# Patient Record
Sex: Female | Born: 1980 | Hispanic: Yes | Marital: Single | Smoking: Never smoker
Health system: Southern US, Community
[De-identification: ages and names within clinical notes are randomized; demographics above are authoritative.]

## PROBLEM LIST (undated history)

## (undated) DIAGNOSIS — I10 Essential (primary) hypertension: Secondary | ICD-10-CM

## (undated) HISTORY — DX: Essential (primary) hypertension: I10

---

## 2007-03-26 ENCOUNTER — Ambulatory Visit: Payer: Self-pay | Admitting: Internal Medicine

## 2007-03-27 ENCOUNTER — Ambulatory Visit: Payer: Self-pay | Admitting: *Deleted

## 2008-12-29 ENCOUNTER — Emergency Department (HOSPITAL_COMMUNITY): Admission: EM | Admit: 2008-12-29 | Discharge: 2008-12-29 | Payer: Self-pay | Admitting: Emergency Medicine

## 2010-08-26 LAB — CBC
MCHC: 34.3 g/dL (ref 30.0–36.0)
MCV: 93.4 fL (ref 78.0–100.0)
Platelets: 300 10*3/uL (ref 150–400)
RDW: 13.4 % (ref 11.5–15.5)
WBC: 16.9 10*3/uL — ABNORMAL HIGH (ref 4.0–10.5)

## 2010-08-26 LAB — URINALYSIS, ROUTINE W REFLEX MICROSCOPIC
Glucose, UA: NEGATIVE mg/dL
Leukocytes, UA: NEGATIVE
Specific Gravity, Urine: 1.019 (ref 1.005–1.030)

## 2010-08-26 LAB — DIFFERENTIAL
Eosinophils Relative: 1 % (ref 0–5)
Lymphocytes Relative: 24 % (ref 12–46)
Lymphs Abs: 4.1 10*3/uL — ABNORMAL HIGH (ref 0.7–4.0)
Monocytes Absolute: 0.7 10*3/uL (ref 0.1–1.0)
Monocytes Relative: 4 % (ref 3–12)

## 2010-08-26 LAB — COMPREHENSIVE METABOLIC PANEL
AST: 33 U/L (ref 0–37)
Albumin: 3.8 g/dL (ref 3.5–5.2)
Calcium: 8.8 mg/dL (ref 8.4–10.5)
Chloride: 107 mEq/L (ref 96–112)
Creatinine, Ser: 0.61 mg/dL (ref 0.4–1.2)
GFR calc Af Amer: >60 mL/min (ref >60)
Total Protein: 6.9 g/dL (ref 6.0–8.3)

## 2010-08-26 LAB — URINE MICROSCOPIC-ADD ON

## 2013-09-24 ENCOUNTER — Encounter (HOSPITAL_COMMUNITY): Payer: Self-pay | Admitting: Emergency Medicine

## 2013-09-24 ENCOUNTER — Emergency Department (INDEPENDENT_AMBULATORY_CARE_PROVIDER_SITE_OTHER)
Admission: EM | Admit: 2013-09-24 | Discharge: 2013-09-24 | Disposition: A | Discharge status: Home / Self Care | Payer: Self-pay | Source: Home / Self Care | Attending: Family Medicine | Admitting: Family Medicine

## 2013-09-24 DIAGNOSIS — J329 Chronic sinusitis, unspecified: Secondary | ICD-10-CM

## 2013-09-24 DIAGNOSIS — J45909 Unspecified asthma, uncomplicated: Secondary | ICD-10-CM

## 2013-09-24 MED ORDER — PREDNISONE 10 MG PO TABS: ORAL_TABLET | ORAL | Status: DC

## 2013-09-24 MED ORDER — FLUTICASONE PROPIONATE 50 MCG/ACT NA SUSP: 2.0000 | Freq: Two times a day (BID) | NASAL | Status: DC

## 2013-09-24 MED ORDER — ALBUTEROL SULFATE HFA 108 (90 BASE) MCG/ACT IN AERS
INHALATION_SPRAY | RESPIRATORY_TRACT | Status: AC
  Filled 2013-09-24: qty 6.7

## 2013-09-24 MED ORDER — IPRATROPIUM-ALBUTEROL 0.5-2.5 (3) MG/3ML IN SOLN
RESPIRATORY_TRACT | Status: AC
  Filled 2013-09-24: qty 3

## 2013-09-24 MED ORDER — FEXOFENADINE-PSEUDOEPHED ER 60-120 MG PO TB12: 1.0000 | ORAL_TABLET | Freq: Two times a day (BID) | ORAL | Status: DC

## 2013-09-24 MED ORDER — IPRATROPIUM-ALBUTEROL 0.5-2.5 (3) MG/3ML IN SOLN
3.0000 mL | Freq: Once | RESPIRATORY_TRACT | Status: AC
  Administered 2013-09-24: 3 mL via RESPIRATORY_TRACT

## 2013-09-24 MED ORDER — ALBUTEROL SULFATE HFA 108 (90 BASE) MCG/ACT IN AERS
1.0000 | INHALATION_SPRAY | RESPIRATORY_TRACT | Status: DC
  Administered 2013-09-24: 2 via RESPIRATORY_TRACT

## 2013-09-24 MED ORDER — AEROCHAMBER PLUS FLO-VU LARGE MISC
1.0000 | Freq: Once | Status: AC
  Administered 2013-09-24: 1

## 2013-09-24 MED ORDER — KETOTIFEN FUMARATE 0.025 % OP SOLN: 1.0000 [drp] | Freq: Two times a day (BID) | OPHTHALMIC | Status: DC

## 2013-09-24 MED ORDER — AZITHROMYCIN 250 MG PO TABS: ORAL_TABLET | ORAL | Status: DC

## 2013-09-24 NOTE — ED Provider Notes (Signed)
CSN: [id number]     Arrival date & time 09/24/13  1102 History   None    Chief Complaint  Patient presents with  . Allergies   (Consider location/radiation/quality/duration/timing/severity/associated sxs/prior Treatment) HPI Comments: 33 year old female presents complaining of allergies. She has itchy, watery, red, irritated eyes, scratchy throat, nasal congestion, cough, chest tightness, and bilateral jaw pain. These symptoms have been present for a month and have been constant, neither worsening nor getting any better. She has not taken any medication to try to help her symptoms. She denies fever, chills, chest pain, shortness of breath. Cough is dry, nonproductive. She has previous history of seasonal allergies. She has no history of asthma.   History reviewed. No pertinent past medical history. History reviewed. No pertinent past surgical history. No family history on file. History  Substance Use Topics  . Smoking status: Never Smoker   . Smokeless tobacco: Not on file  . Alcohol Use: No   OB History   Grav Para Term Preterm Abortions TAB SAB Ect Mult Living                 Review of Systems  Constitutional: Negative for fever, chills and fatigue.  HENT: Positive for postnasal drip, rhinorrhea, sinus pressure and sore throat (scratchy). Negative for congestion and ear pain.   Eyes: Positive for pain, discharge, redness and itching.  Respiratory: Positive for cough and chest tightness. Negative for shortness of breath.   Cardiovascular: Negative for chest pain.  Gastrointestinal: Negative for nausea, vomiting, abdominal pain and diarrhea.  Musculoskeletal: Negative for back pain.  Skin: Negative for rash.  All other systems reviewed and are negative.   Allergies  Review of patient's allergies indicates not on file.  Home Medications   Prior to Admission medications   Not on File   BP 150/92  Pulse 80  Temp(Src) 98.4 F (36.9 C) (Oral)  Resp 14  SpO2 98%  LMP  09/22/2013 Physical Exam  Nursing note and vitals reviewed. Constitutional: She is oriented to person, place, and time. Vital signs are normal. She appears well-developed and well-nourished. No distress.  HENT:  Head: Normocephalic and atraumatic.  Right Ear: Tympanic membrane, external ear and ear canal normal.  Left Ear: Tympanic membrane, external ear and ear canal normal.  Nose: Mucosal edema and rhinorrhea present. Right sinus exhibits frontal sinus tenderness. Right sinus exhibits no maxillary sinus tenderness. Left sinus exhibits frontal sinus tenderness. Left sinus exhibits no maxillary sinus tenderness.  Mouth/Throat: Uvula is midline, oropharynx is clear and moist and mucous membranes are normal.  Neck: Normal range of motion. Neck supple.  Cardiovascular: Normal rate, regular rhythm and normal heart sounds.  Exam reveals no gallop and no friction rub.   No murmur heard. Pulmonary/Chest: Effort normal. No accessory muscle usage. Not tachypneic. No respiratory distress. She has wheezes (scattered ). She has no rhonchi. She has no rales.  Lymphadenopathy:       Head (right side): Preauricular adenopathy present.       Head (left side): Preauricular adenopathy present.  Neurological: She is alert and oriented to person, place, and time. She has normal strength. Coordination normal.  Skin: Skin is warm and dry. No rash noted. She is not diaphoretic.  Psychiatric: She has a normal mood and affect. Judgment normal.    ED Course  Procedures (including critical care time) Labs Review Labs Reviewed - No data to display  Imaging Review No results found.   MDM   1. Hay fever with asthma  2. Sinusitis    After 1 DuoNeb, there is still some wheezing although it has resolved significantly, she feels much better. Will discharge with treatment for a fever of asthma, also azithromycin to cover possible atypical infection. Followup when necessary if not improving   Meds ordered this  encounter  Medications  . ipratropium-albuterol (DUONEB) 0.5-2.5 (3) MG/3ML nebulizer solution 3 mL    Sig:   . predniSONE (DELTASONE) 10 MG tablet    Sig: 4 tabs PO QD for 4 days; 3 tabs PO QD for 3 days; 2 tabs PO QD for 2 days; 1 tab PO QD for 1 day    Dispense:  30 tablet    Refill:  0    Order Specific Question:  Supervising Provider    Answer:  Clementeen GrahamOREY, EVAN, S [[id number]]  . fluticasone (FLONASE) 50 MCG/ACT nasal spray    Sig: Place 2 sprays into both nostrils 2 (two) times daily. Decrease to 2 sprays/nostril daily after 5 days    Dispense:  16 g    Refill:  2    Order Specific Question:  Supervising Provider    Answer:  Clementeen GrahamOREY, EVAN, S [[id number]]  . azithromycin (ZITHROMAX Z-PAK) 250 MG tablet    Sig: Use as directed    Dispense:  6 each    Refill:  0    Order Specific Question:  Supervising Provider    Answer:  Clementeen GrahamOREY, EVAN, S [[id number]]  . ketotifen (ZADITOR) 0.025 % ophthalmic solution    Sig: Place 1 drop into both eyes 2 (two) times daily.    Dispense:  5 mL    Refill:  0    Order Specific Question:  Supervising Provider    Answer:  Clementeen GrahamOREY, EVAN, S [id number]]  . fexofenadine-pseudoephedrine (ALLEGRA-D) 60-120 MG per tablet    Sig: Take 1 tablet by mouth every 12 (twelve) hours.    Dispense:  30 tablet    Refill:  0    Order Specific Question:  Supervising Provider    Answer:  Clementeen GrahamOREY, EVAN, S [id number]]  . albuterol (PROVENTIL HFA;VENTOLIN HFA) 108 (90 BASE) MCG/ACT inhaler 1-2 puff    Sig:   . AEROCHAMBER PLUS FLO-VU LARGE MISC 1 each    Sig:        Joy GoodZachary H Jonte Shiller, PA-C 09/24/13 1253

## 2013-09-24 NOTE — ED Notes (Signed)
Pt c/o allergies onset 1 month Sx include itchy/watery/irritated/redness of eyes Also having itchy throat, congestion, runny nose Alert w/no signs of acute distress.

## 2013-09-26 NOTE — ED Provider Notes (Signed)
Medical screening examination/treatment/procedure(s) were performed by a resident physician or non-physician practitioner and as the supervising physician I was immediately available for consultation/collaboration.  Victorya Hillman, MD    Maleik Vanderzee S Abigale Dorow, MD 09/26/13 0858 

## 2014-09-07 ENCOUNTER — Ambulatory Visit: Payer: Self-pay | Attending: Internal Medicine

## 2014-09-08 ENCOUNTER — Encounter (HOSPITAL_COMMUNITY): Payer: Self-pay | Admitting: Emergency Medicine

## 2014-09-08 ENCOUNTER — Emergency Department (HOSPITAL_COMMUNITY)
Admission: EM | Admit: 2014-09-08 | Discharge: 2014-09-08 | Disposition: A | Discharge status: Home / Self Care | Payer: No Typology Code available for payment source | Source: Home / Self Care | Attending: Family Medicine | Admitting: Family Medicine

## 2014-09-08 DIAGNOSIS — J309 Allergic rhinitis, unspecified: Secondary | ICD-10-CM

## 2014-09-08 DIAGNOSIS — J0140 Acute pansinusitis, unspecified: Secondary | ICD-10-CM

## 2014-09-08 DIAGNOSIS — H6983 Other specified disorders of Eustachian tube, bilateral: Secondary | ICD-10-CM

## 2014-09-08 MED ORDER — IPRATROPIUM BROMIDE 0.06 % NA SOLN: 2.0000 | Freq: Four times a day (QID) | NASAL | Status: DC

## 2014-09-08 MED ORDER — PREDNISONE 20 MG PO TABS
60.0000 mg | ORAL_TABLET | Freq: Once | ORAL | Status: AC
  Administered 2014-09-08: 60 mg via ORAL

## 2014-09-08 MED ORDER — FLUTICASONE PROPIONATE 50 MCG/ACT NA SUSP: 2.0000 | Freq: Every day | NASAL | Status: DC

## 2014-09-08 MED ORDER — FLUCONAZOLE 150 MG PO TABS: 150.0000 mg | ORAL_TABLET | Freq: Every day | ORAL | Status: DC

## 2014-09-08 MED ORDER — OLOPATADINE HCL 0.2 % OP SOLN: 1.0000 [drp] | Freq: Every day | OPHTHALMIC | Status: DC

## 2014-09-08 MED ORDER — AMOXICILLIN-POT CLAVULANATE 875-125 MG PO TABS: 1.0000 | ORAL_TABLET | Freq: Two times a day (BID) | ORAL | Status: DC

## 2014-09-08 MED ORDER — PREDNISONE 20 MG PO TABS
ORAL_TABLET | ORAL | Status: AC
  Filled 2014-09-08: qty 3

## 2014-09-08 NOTE — ED Notes (Signed)
Patient c/o rhinorrhea, watery itchy eyes, and sinus headaches. Patient reports she has been taking Cetrizine and OTC eyedrops with no relief. Patient reports she had a fever last night. Patient is in NAD.

## 2014-09-08 NOTE — Discharge Instructions (Signed)
You likely have developed a sinus infection that will need antibiotics in order to resolve. Please take them for the full 10 days. Please take the Diflucan if she developed a yeast infection. The majority of your symptoms are related to allergies. Your given a dose of steroids to help with your symptoms. Please start the nasal Atrovent to help with nasal discharge, the Flonase to help decrease your sinus and nasal inflammation. Please start a daily allergy pill such as Zyrtec, Allegra, or Claritin. Please start using the Pataday drops for eye relief, you can purchase over-the-counter Zaditor if the Pataday is too expensive. Please also consider using Benadryl at night to help with her symptoms and so you can get some sleep. Sudafed might also be a good medicine use during the daytime though this may raise her blood pressure can give you palpitations of the heart.  Es probable que haya desarrollado una infeccin en los senos que se necesitan antibiticos, a fin de [place]. Por favor, llevarlos a los 10 das completos. Por favor tome el Diflucan si se desarrolla una infeccin por levaduras. La mayora de sus sntomas estn relacionados con alergias. Su administra una dosis de esteroides para ayudar con sus sntomas. Por favor iniciar el Atrovent nasal para ayudar con la secrecin nasal, la Flonase para ayudar a disminuir la inflamacin nasal y los senos paranasales. Por favor, iniciar una pldora diaria de alergia tales como Zyrtec, Allegra, o Claritin. Por favor, empezar a Therapist, artusar el Pataday gotas para el alivio del ojo, puede comprar over-the-counter Zaditor si el Pataday es demasiado caro. Por favor, considere el uso de Benadryl por la noche para ayudar con sus sntomas y para que pueda dormir un poco. Sudafed tambin podra ser un buen uso de los medicamentos durante [address], aunque esto puede aumentar su presin arterial le puede dar palpitaciones del corazn.

## 2014-09-08 NOTE — ED Provider Notes (Signed)
CSN: [id number]     Arrival date & time 09/08/14  1221 History   First MD Initiated Contact with Patient 09/08/14 1343     Chief Complaint  Patient presents with  . Allergies   (Consider location/radiation/quality/duration/timing/severity/associated sxs/prior Treatment) HPI   1 week ago devloped red itchy eyes, rinorrhea, and HA. HA in the front of head and worse w/ leaning forward. Getting worse. Started subjective fevers last night. tyleno 500 w/ benefit. H/o allergies every spring.  Denies chest pain, palpitations, nausea, vomiting, diarrhea, constipation, vomiting, rash, shortness of breath. Now also with bilateral ear pain.   History reviewed. No pertinent past medical history. History reviewed. No pertinent past surgical history. Family History  Problem Relation Age of Onset  . Cancer Neg Hx   . Diabetes Neg Hx   . Heart failure Neg Hx   . Hyperlipidemia Neg Hx   . Hypertension Neg Hx    History  Substance Use Topics  . Smoking status: Never Smoker   . Smokeless tobacco: Not on file  . Alcohol Use: No   OB History    No data available     Review of Systems Per HPI with all other pertinent systems negative.   Allergies  Review of patient's allergies indicates no known allergies.  Home Medications   Prior to Admission medications   Medication Sig Start Date End Date Taking? Authorizing Provider  amoxicillin-clavulanate (AUGMENTIN) 875-125 MG per tablet Take 1 tablet by mouth 2 (two) times daily. 09/08/14   Ozella Rocks, MD  fluconazole (DIFLUCAN) 150 MG tablet Take 1 tablet (150 mg total) by mouth daily. Repeat dose in 3 days 09/08/14   Ozella Rocks, MD  fluticasone [facility]) 50 MCG/ACT nasal spray Place 2 sprays into both nostrils at bedtime. 09/08/14   Ozella Rocks, MD  ipratropium (ATROVENT) 0.06 % nasal spray Place 2 sprays into both nostrils 4 (four) times daily. 09/08/14   Ozella Rocks, MD  Olopatadine HCl 0.2 % SOLN Apply 1 drop to eye daily.  09/08/14   Ozella Rocks, MD   BP 151/98 mmHg  Pulse 72  Temp(Src) 97.3 F (36.3 C) (Oral)  Resp 12  SpO2 97%  LMP 08/26/2014 Physical Exam Physical Exam  Constitutional: oriented to person, place, and time. appears well-developed and well-nourished. No distress.  HENT:  Head: Normocephalic and atraumatic.  Pharyngeal cobblestoning.  Frontal and maxillary sinus ttp Boggy nasal turbinates Eyes: EOMI. PERRL.  Neck: Normal range of motion.  Cardiovascular: RRR, no m/r/g, 2+ distal pulses,  Pulmonary/Chest: Effort normal and breath sounds normal. No respiratory distress.  Abdominal: Soft. Bowel sounds are normal. NonTTP, no distension.  Musculoskeletal: Normal range of motion. Non ttp, no effusion.  Neurological: alert and oriented to person, place, and time.  Skin: Skin is warm. No rash noted. non diaphoretic.  Psychiatric: normal mood and affect. behavior is normal. Judgment and thought content normal.   ED Course  Procedures (including critical care time) Labs Review Labs Reviewed - No data to display  Imaging Review No results found.   MDM   1. Allergic rhinitis, unspecified allergic rhinitis type   2. Acute pansinusitis, recurrence not specified   3. Eustachian tube dysfunction, bilateral    Start Augmentin for pansinusitis. Initially this was likely due to allergic response but patient with ongoing symptoms for 7 days with new onset symptoms of fevers and worsening facial pain.   Start nasal Atrovent, Flonase, ibuprofen, daily allergy pill such as Zyrtec or Claritin. Use  Pataday or Zaditor for right eye relief. Benadryl for additional patient relief and Sudafed also for additional relief the patient aware that this may cause palpitations and elevation of blood pressure. Prevacid 60 mg given for general allergic relief primarily for eustachian tube dysfunction.    Ozella Rocksavid J Merrell, MD 09/08/14 [id number]

## 2015-03-13 ENCOUNTER — Ambulatory Visit: Payer: Self-pay | Admitting: Internal Medicine

## 2021-06-12 ENCOUNTER — Other Ambulatory Visit: Payer: Self-pay

## 2021-06-12 DIAGNOSIS — N6325 Unspecified lump in the left breast, overlapping quadrants: Secondary | ICD-10-CM

## 2021-07-12 ENCOUNTER — Ambulatory Visit
Admission: RE | Admit: 2021-07-12 | Discharge: 2021-07-12 | Disposition: A | Discharge status: Home / Self Care | Payer: No Typology Code available for payment source | Source: Ambulatory Visit | Attending: Obstetrics and Gynecology | Admitting: Obstetrics and Gynecology

## 2021-07-12 ENCOUNTER — Other Ambulatory Visit: Payer: Self-pay | Admitting: Obstetrics and Gynecology

## 2021-07-12 ENCOUNTER — Other Ambulatory Visit: Payer: Self-pay

## 2021-07-12 ENCOUNTER — Encounter (INDEPENDENT_AMBULATORY_CARE_PROVIDER_SITE_OTHER): Payer: Self-pay

## 2021-07-12 ENCOUNTER — Ambulatory Visit
Admission: RE | Admit: 2021-07-12 | Discharge: 2021-07-12 | Disposition: A | Discharge status: Home / Self Care | Payer: Self-pay | Source: Ambulatory Visit | Attending: Obstetrics and Gynecology | Admitting: Obstetrics and Gynecology

## 2021-07-12 ENCOUNTER — Ambulatory Visit: Payer: Self-pay | Admitting: *Deleted

## 2021-07-12 VITALS — BP 134/84 | Wt 146.2 lb

## 2021-07-12 DIAGNOSIS — N6325 Unspecified lump in the left breast, overlapping quadrants: Secondary | ICD-10-CM

## 2021-07-12 DIAGNOSIS — N644 Mastodynia: Secondary | ICD-10-CM

## 2021-07-12 DIAGNOSIS — Z1239 Encounter for other screening for malignant neoplasm of breast: Secondary | ICD-10-CM

## 2021-07-12 DIAGNOSIS — R87611 Atypical squamous cells cannot exclude high grade squamous intraepithelial lesion on cytologic smear of cervix (ASC-H): Secondary | ICD-10-CM

## 2021-07-12 NOTE — Progress Notes (Signed)
Joy Chen is a 41 y.o. female who presents to [facility] clinic today with complaint of left breast lump and pain x 8 months. Patient stated the pain comes and goes. Patient rates the pain at a 5-6 out of 10.   Patient referred to BCCCP by the [facility] due to having an abnormal Pap smear 05/01/2021 that a colposcopy is recommended for follow up.   Pap Smear: Pap smear not completed today. Last Pap smear was 05/01/2021 at the [facility] clinic and was abnormal - ASC-H/AGC/with positive HPV . Per patient has no history of an abnormal Pap smear prior to her most recent Pap smear. Last Pap smear result is available in Epic.   Physical exam: Breasts Breasts symmetrical. No skin abnormalities bilateral breasts. No nipple retraction bilateral breasts. No nipple discharge bilateral breasts. No lymphadenopathy. No lumps palpated right breast. Palpated a lump within the left breast at 9 o'clock 5 cm from the nipple. Complaints of left inner lower quadrant breast pain on exam.     Pelvic/Bimanual Pap is not indicated today per BCCCP guidelines.   Smoking History: Patient has never smoked.   Patient Navigation: Patient education provided. Access to services provided for patient through [place] program. Spanish interpreter Natale Lay from [facility] provided.    Breast and Cervical Cancer Risk Assessment: Patient does not have family history of breast cancer, known genetic mutations, or radiation treatment to the chest before age 72. Patient does not have history of cervical dysplasia, immunocompromised, or DES exposure in-utero.  Risk Assessment     Risk Scores       07/12/2021   Last edited by: Narda Rutherford, LPN   5-year risk: 0.3 %   Lifetime risk: 5.7 %            A: BCCCP exam without pap smear Complaint of left breast lump and pain.  P: Referred patient to the [facility] for a diagnostic mammogram.  Appointment scheduled Thursday, July 12, 2021 at 1045.  Referred patient to the [facility] for a colposcopy to follow up for her abnormal Pap smear. Appointment scheduled Tuesday, July 24, 2021 at 0915.  Priscille Heidelberg, RN 07/12/2021 8:34 AM

## 2021-07-12 NOTE — Patient Instructions (Addendum)
Explained breast self awareness with Joy Chen. Patient did not need a Pap smear today due to last Pap smear was 05/01/2021. Explained the colposcopy the recommended follow up for her abnormal Pap smear. Referred patient to the [facility] for [facility] for a colposcopy to follow up for her abnormal Pap smear. Appointment scheduled Tuesday, July 24, 2021 at 0915. Referred patient to the [facility] for a diagnostic mammogram. Appointment scheduled Thursday, July 12, 2021 at 1045. Patient aware of appointments and will be there. [name] verbalized understanding.  Dianelys Scinto, Kathaleen Maser, RN 8:34 AM

## 2021-07-17 ENCOUNTER — Ambulatory Visit: Payer: Self-pay

## 2021-07-24 ENCOUNTER — Encounter: Payer: Self-pay | Admitting: Obstetrics and Gynecology

## 2021-07-24 ENCOUNTER — Other Ambulatory Visit (HOSPITAL_COMMUNITY)
Admission: RE | Admit: 2021-07-24 | Discharge: 2021-07-24 | Disposition: A | Discharge status: Home / Self Care | Payer: Self-pay | Source: Ambulatory Visit | Attending: Obstetrics and Gynecology | Admitting: Obstetrics and Gynecology

## 2021-07-24 ENCOUNTER — Other Ambulatory Visit: Payer: Self-pay

## 2021-07-24 ENCOUNTER — Ambulatory Visit (INDEPENDENT_AMBULATORY_CARE_PROVIDER_SITE_OTHER): Payer: Self-pay | Admitting: Obstetrics and Gynecology

## 2021-07-24 DIAGNOSIS — R87610 Atypical squamous cells of undetermined significance on cytologic smear of cervix (ASC-US): Secondary | ICD-10-CM

## 2021-07-24 DIAGNOSIS — R87619 Unspecified abnormal cytological findings in specimens from cervix uteri: Secondary | ICD-10-CM

## 2021-07-24 DIAGNOSIS — R87810 Cervical high risk human papillomavirus (HPV) DNA test positive: Secondary | ICD-10-CM

## 2021-07-24 LAB — POCT PREGNANCY, URINE: Preg Test, Ur: NEGATIVE

## 2021-07-24 NOTE — Patient Instructions (Signed)
Biopsia de endometrio Endometrial Biopsy La biopsia de endometrio es un procedimiento que se realiza para extraer muestras de tejido del endometrio, que es el revestimiento del tero. El tejido que se extrae puede ser revisado con microscopio para determinar si hay alguna enfermedad. Este procedimiento se [place] para Engineer, maintenance (IT) de endometrio, la tuberculosis endometrial, los plipos u otras afecciones inflamatorias. Este procedimiento tambin puede usarse para investigar los sangrados uterinos a fin de Teacher, adult education en qu etapa del ciclo menstrual est o de qu modo los niveles de hormonas afectan el revestimiento del tero. Informe al mdico acerca de lo siguiente: Cualquier alergia que tenga. Todos los [employer] [place], incluidos vitaminas, hierbas, gotas oftlmicas, cremas y medicamentos de venta libre. Problemas previos que usted o algn miembro de su familia hayan tenido con los anestsicos. Cualquier trastorno de la sangre que tenga. Cirugas a las que se haya sometido. Cualquier afeccin mdica que tenga. Si est embarazada o podra estarlo. Cules son los riesgos? En general, se trata de un procedimiento seguro. Sin embargo, pueden ocurrir complicaciones, por ejemplo: Sangrado. Infecciones plvicas. Puncin en la pared del tero con el dispositivo utilizado para tomar la biopsia (raro). Reacciones alrgicas a los medicamentos. Qu ocurre antes del procedimiento? Lleve un registro de sus ciclos menstruales segn se lo haya indicado su mdico. Puede ser necesario que programe el procedimiento para un momento especfico del ciclo menstrual. Puede llevar un apsito sanitario para usar despus del procedimiento. Haga que alguien la lleve a su casa desde el hospital o la clnica. Consulte al mdico sobre: Quarry manager o suspender los medicamentos que [place] habitualmente. Esto es muy importante si toma medicamentos para la diabetes, para la artritis o  anticoagulantes. Tomar medicamentos como aspirina e ibuprofeno. Estos medicamentos pueden tener un efecto anticoagulante en la [place]. No tome estos medicamentos a menos que el mdico se lo indique. Usar medicamentos de venta libre, vitaminas, hierbas y suplementos. Qu ocurre durante el procedimiento? Deber recostarse en una camilla con los pies y las piernas elevados, como en el examen plvico. El mdico insertar un instrumento (espculo) en la vagina para observar el cuello uterino. El cuello uterino ser desinfectado con una solucin antisptica. Para adormecer el cuello uterino, le aplicarn un medicamento (anestsico local). Se utilizar un frceps (tenculo) para mantener el cuello uterino firme. Se insertar un instrumento delgado, similar a una varilla (sonda uterina) a travs del cuello uterino para Teacher, adult education su longitud y TEFL teacher de donde se tomar la muestra para la biopsia. A continuacin, se pasa un tubo delgado y flexible (catter) a travs del cuello uterino [employer]. El catter se Risk manager para Barista de tejido del endometrio para la biopsia. Se retirarn el catter y [place], y la [place] se enviar al laboratorio para ser examinada. El procedimiento puede variar segn el mdico y el hospital. Qu puedo esperar despus del procedimiento? Descansar en un rea de recuperacin hasta que est lista para volver a su casa. Sentir clicos leves y tendr una pequea cantidad de sangrado vaginal. Esto es normal. Puede tener una pequea cantidad de sangrado vaginal durante unos das. Esto es normal. Es su responsabilidad retirar los [place] del procedimiento. Pregntele a su mdico, o a un miembro del personal del departamento donde se realice el procedimiento, cundo estarn [employer]. Siga estas instrucciones en su casa: Use los medicamentos de venta libre y los recetados solamente como se lo haya indicado el mdico. No utilice tampones,  duchas vaginales ni tenga relaciones sexuales  hasta que el mdico lo autorice. Retome sus actividades normales segn lo indicado por el mdico. Pregntele al mdico qu actividades son seguras para usted. Siga las indicaciones del mdico relacionadas con la restriccin a ciertas actividades, como las restricciones para Optometrist ejercicios fsicos extenuantes o levantar objetos pesados. Cumpla con todas las visitas de seguimiento. Esto es importante. Comunquese con un mdico: Tiene un sangrado abundante o sangra durante ms de 2 das despus del procedimiento. Tiene secrecin vaginal con mal olor. Tiene fiebre o escalofros. Tiene una sensacin de ardor al [place] o tiene dificultad para Garment/textile technologist. Siente un dolor intenso en la regin inferior del abdomen. Solicite ayuda de inmediato si: Siente clicos intensos en el estmago o en la espalda. Elimina cogulos grandes. La hemorragia aumenta. Se siente dbil o mareada, o se desmaya o pierde la conciencia. Resumen La biopsia de endometrio es un procedimiento que se realiza para extraer muestras de tejido del endometrio, que es el revestimiento del tero. La muestra de tejido que se extrae ser Carolyne Fiscal con microscopio para determinar si hay alguna enfermedad. Este procedimiento se [place] para Engineer, maintenance (IT) de endometrio, la tuberculosis endometrial, los plipos u otras afecciones inflamatorias. Despus del procedimiento, es normal tener clicos leves y tendr [place] pequea cantidad de sangrado vaginal durante [employer]. No utilice tampones, duchas vaginales ni tenga relaciones sexuales hasta que el mdico lo autorice. Pregntele al mdico qu actividades son seguras para usted. Esta informacin no tiene Marine scientist el consejo del mdico. Asegrese de hacerle al mdico cualquier pregunta que tenga. Document Revised: 01/12/2020 Document Reviewed: 01/12/2020 Elsevier Patient Education  2022 [place],  cuidados posteriores Colposcopy, Care After La siguiente informacin ofrece orientacin sobre cmo cuidarse despus del procedimiento. El mdico tambin podr darle indicaciones ms especficas. Si tiene problemas o preguntas, llame al mdico. Qu puedo esperar despus del procedimiento? Si no le han tomado una [place] de tejido (no le realizaron una biopsia), es posible que solo presente unas manchas de sangre durante [place]. Puede retomar sus [employer]. Si le tomaron [place] de tejido, es frecuente que tenga lo siguiente: Sensibilidad y Social research officer, government leve. [place]. Una poca cantidad de sangrado o lquido (secrecin) que salen de la vagina. El lquido se ver oscuro y [place]. Puede tener esto durante [employer]. El lquido puede deberse a un lquido que se [place] [employer] [place] [employer] procedimiento. Es posible que deba usar un apsito sanitario. Manchas de [employer] al menos 48 horas despus del procedimiento. Siga estas instrucciones en su casa: Medicamentos Use los medicamentos de venta libre y los recetados solamente como se lo haya indicado el mdico. Pregntele al mdico qu analgsicos de venta libre y recetados puede comenzar a tomar nuevamente. Esto es muy importante si toma anticoagulantes. Actividad Por al menos 3 das, o el tiempo que le haya indicado el mdico, evite lo siguiente: Las duchas vaginales. Los tampones. Tener sexo. Retome sus actividades normales como se lo haya indicado el mdico. Pregntele al mdico qu actividades son seguras para usted. Instrucciones generales Pregunte al mdico si puede tomar baos de inmersin, nadar o usar el jacuzzi. Puede ducharse. Si [place] un mtodo anticonceptivo, contine usndolo. Concurra a [place]. Comunquese con un mdico si: Tiene fiebre o escalofros. Se desmaya o tiene sensacin de desvanecimiento. Solicite ayuda de inmediato si: Tiene mucho sangrado de la vagina. Mucho  sangrado significa que el sangrado empapa una toalla higinica en menos de 1 hora. Elimina grumos de [place] (  cogulos de sangre) por la vagina. Tiene signos que podran indicar que tiene una infeccin. El lquido proveniente de la vagina es: Diferente de lo normal. Holmen. Tiene mal olor. Tiene mucho dolor o clicos en la parte baja del vientre que no se alivian con medicamentos. Resumen Si no le han tomado [place] de tejido, es posible que solo presente unas manchas de sangre durante [place]. Puede retomar sus [employer]. Si le tomaron [place] de tejido, es comn tener dolor leve durante algunos das y [place] de sangre durante 48 horas. Evite hacerse duchas vaginales, usar tampones y Best boy sexo durante al menos 3 das despus del procedimiento o durante el tiempo que le hayan indicado. Busque ayuda de inmediato si presenta mucho sangrado, mucho dolor intenso o signos de infeccin. Esta informacin no tiene Marine scientist el consejo del mdico. Asegrese de hacerle al mdico cualquier pregunta que tenga. Document Revised: 10/24/2020 Document Reviewed: 10/24/2020 Elsevier Patient Education  [place].

## 2021-07-24 NOTE — Progress Notes (Signed)
? ? ?  GYNECOLOGY CLINIC COLPOSCOPY PROCEDURE NOTE ? ?41 y.o. No obstetric history on file. here for colposcopy for ASC cannot exclude high grade lesion  Hospital) pap smear on 1/23. Discussed role for HPV in cervical dysplasia, need for surveillance. ? ?Patient given informed consent, signed copy in the chart, time out was performed.  Placed in lithotomy position. Cervix viewed with speculum and colposcope after application of acetic acid.  ? ?Colposcopy adequate? Yes ? acetowhite lesion(s) noted at 12 & 6 o'clock; corresponding biopsies obtained.  ECC specimen obtained. Monsel's applied ?All specimens were labelled and sent to pathology. ? ? ? ? ?ENDOMETRIAL BIOPSY     ?The indications for endometrial biopsy were reviewed.  AGCUS on 1/23 pap smear.  Risks of the biopsy including cramping, bleeding, infection, uterine perforation, inadequate specimen and need for additional procedures  were discussed. The patient states she understands and agrees to undergo procedure today. Consent was signed. Time out was performed. Urine HCG was negative. ?During the pelvic exam, the cervix was prepped with Betadine. A single-toothed tenaculum was placed on the anterior lip of the cervix to stabilize it. The 3 mm pipelle was introduced into the endometrial cavity without difficulty to a depth of 8 cm, and a moderate amount of tissue was obtained and sent to pathology. The instruments were removed from the patient's vagina. Minimal bleeding from the cervix was noted. The patient tolerated the procedure well. Routine post-procedure instructions were given to the patient.    ? ? ?Patient was given post procedure instructions.  Will follow up pathology and manage accordingly.  Routine preventative health maintenance measures emphasized. ? ? ? ?GYN U/S scheduled as well ?Live interrupter used during today's visit. ? ?Nettie Elm MD, FACOG ?Attending Obstetrician & Gynecologist ?Center for [employer], [facility] ? ? ? ? ?  ?

## 2021-07-25 ENCOUNTER — Telehealth: Payer: Self-pay

## 2021-07-25 NOTE — Telephone Encounter (Signed)
Call received from Vernona Rieger with [facility] lab stating 3 specimens were sent to lab yesterday. Requisition indicates cervical biopsy, ECC, and endometrial biopsy. 2 specimens labeled as ECC and endometrial biopsy. Per chart review biopsies were taken from 12 and 6 o'clock. Information given to lab for unlabeled specimen. ?

## 2021-07-26 LAB — SURGICAL PATHOLOGY

## 2021-07-31 ENCOUNTER — Telehealth: Payer: Self-pay | Admitting: General Practice

## 2021-07-31 NOTE — Telephone Encounter (Signed)
Called patient with Joy Chen assisting with Spanish interpretation and informed her of colposcopy results and [facility] treatment recommendation. Explained to procedure to patient and discussed importance of getting financial application in. Informed patient of appt in office to discuss procedure and that someone would be notifying her with the surgery appt. Patient verbalized understanding to all.  ?

## 2021-07-31 NOTE — Telephone Encounter (Signed)
Called patient with Raquel for spanish interpreter and informed patient of colposcopy results. Discussed recommendation of    ?

## 2021-08-07 ENCOUNTER — Ambulatory Visit
Admission: RE | Admit: 2021-08-07 | Discharge: 2021-08-07 | Disposition: A | Discharge status: Home / Self Care | Payer: Self-pay | Source: Ambulatory Visit | Attending: Obstetrics and Gynecology | Admitting: Obstetrics and Gynecology

## 2021-08-07 ENCOUNTER — Other Ambulatory Visit: Payer: Self-pay

## 2021-08-07 DIAGNOSIS — R87619 Unspecified abnormal cytological findings in specimens from cervix uteri: Secondary | ICD-10-CM | POA: Insufficient documentation

## 2021-08-08 ENCOUNTER — Other Ambulatory Visit: Payer: Self-pay

## 2021-08-08 ENCOUNTER — Encounter (HOSPITAL_BASED_OUTPATIENT_CLINIC_OR_DEPARTMENT_OTHER): Payer: Self-pay | Admitting: Obstetrics and Gynecology

## 2021-08-08 NOTE — H&P (Signed)
Joy Chen is an 41 y.o. 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 ?[facility] recommended for additional evaluation.  ? ? ?Menstrual History: ?Menarche age: 47 ?Patient's last menstrual period was 07/23/2021 (approximate). ?  ? ?Past Medical History:  ?Diagnosis Date  ? Hypertension   ? ? ?History reviewed. No pertinent surgical history. ? ?Family History  ?Problem Relation Age of Onset  ? Heart disease Mother   ? Cancer Neg Hx   ? Diabetes Neg Hx   ? Heart failure Neg Hx   ? Hyperlipidemia Neg Hx   ? Hypertension Neg Hx   ? ? ?Social History:  reports that she has never smoked. She does not have any smokeless tobacco history on file. She reports current alcohol use. She reports that she does not use drugs. ? ?Allergies: No Known Allergies ? ?No medications prior to admission.  ? ? ?Review of Systems  ?Constitutional: Negative.   ?Respiratory: Negative.    ?Cardiovascular: Negative.   ?Gastrointestinal: Negative.   ?Genitourinary: Negative.   ? ?Height 4\' 11"  (1.499 m), weight 65 kg, last menstrual period 07/23/2021. ?Physical Exam ?Constitutional:   ?   Appearance: Normal appearance.  ?Cardiovascular:  ?   Rate and Rhythm: Regular rhythm.  ?Pulmonary:  ?   Effort: Pulmonary effort is normal.  ?   Breath sounds: Normal breath sounds.  ?Abdominal:  ?   General: Bowel sounds are normal.  ?   Palpations: Abdomen is soft.  ?Genitourinary: ?   Comments: Nl EGBUS, uterus small, mobile, no masses or tenderness ?Neurological:  ?   Mental Status: She is alert.  ? ? ?No results found for this or any previous visit (from the past 24 hour(s)). ? ?No results found. ? ?Assessment/Plan: ?CIN 3/CIS ? ?[facility] for further evaluation. R/B/Post op care reviewed with pt. Pt agrees to proceed. ? ?Chancy Milroy ?08/08/2021, 8:07 PM ? ?

## 2021-08-15 ENCOUNTER — Ambulatory Visit: Payer: Self-pay | Admitting: Obstetrics & Gynecology

## 2021-08-16 ENCOUNTER — Encounter (HOSPITAL_BASED_OUTPATIENT_CLINIC_OR_DEPARTMENT_OTHER)
Admission: RE | Admit: 2021-08-16 | Discharge: 2021-08-16 | Disposition: A | Discharge status: Home / Self Care | Payer: Self-pay | Source: Ambulatory Visit | Attending: Obstetrics and Gynecology | Admitting: Obstetrics and Gynecology

## 2021-08-16 DIAGNOSIS — R87810 Cervical high risk human papillomavirus (HPV) DNA test positive: Secondary | ICD-10-CM | POA: Insufficient documentation

## 2021-08-16 DIAGNOSIS — Z01812 Encounter for preprocedural laboratory examination: Secondary | ICD-10-CM | POA: Insufficient documentation

## 2021-08-16 DIAGNOSIS — R87619 Unspecified abnormal cytological findings in specimens from cervix uteri: Secondary | ICD-10-CM | POA: Insufficient documentation

## 2021-08-16 DIAGNOSIS — R87610 Atypical squamous cells of undetermined significance on cytologic smear of cervix (ASC-US): Secondary | ICD-10-CM | POA: Insufficient documentation

## 2021-08-16 LAB — BASIC METABOLIC PANEL
Anion gap: 7 (ref 5–15)
BUN: 13 mg/dL (ref 6–20)
CO2: 24 mmol/L (ref 22–32)
Calcium: 9.3 mg/dL (ref 8.9–10.3)
Chloride: 107 mmol/L (ref 98–111)
Creatinine, Ser: 0.68 mg/dL (ref 0.44–1.00)
GFR, Estimated: >60 mL/min (ref >60)
Glucose, Bld: 77 mg/dL (ref 70–99)
Potassium: 3.6 mmol/L (ref 3.5–5.1)
Sodium: 138 mmol/L (ref 135–145)

## 2021-08-16 LAB — CBC
HCT: 37.3 % (ref 36.0–46.0)
Hemoglobin: 12.8 g/dL (ref 12.0–15.0)
MCH: 31.8 pg (ref 26.0–34.0)
MCHC: 34.3 g/dL (ref 30.0–36.0)
MCV: 92.6 fL (ref 80.0–100.0)
Platelets: 320 10*3/uL (ref 150–400)
RBC: 4.03 MIL/uL (ref 3.87–5.11)
RDW: 12.9 % (ref 11.5–15.5)
WBC: 8.8 10*3/uL (ref 4.0–10.5)
nRBC: 0 % (ref 0.0–0.2)

## 2021-08-16 LAB — POCT PREGNANCY, URINE: Preg Test, Ur: NEGATIVE

## 2021-08-16 NOTE — Progress Notes (Signed)

## 2021-08-17 ENCOUNTER — Encounter (HOSPITAL_BASED_OUTPATIENT_CLINIC_OR_DEPARTMENT_OTHER)
Admission: RE | Disposition: A | Discharge status: Home / Self Care | Payer: Self-pay | Source: Home / Self Care | Attending: Obstetrics and Gynecology

## 2021-08-17 ENCOUNTER — Other Ambulatory Visit: Payer: Self-pay

## 2021-08-17 ENCOUNTER — Ambulatory Visit (HOSPITAL_BASED_OUTPATIENT_CLINIC_OR_DEPARTMENT_OTHER)
Admission: RE | Admit: 2021-08-17 | Discharge: 2021-08-17 | Disposition: A | Discharge status: Home / Self Care | Payer: Self-pay | Attending: Obstetrics and Gynecology | Admitting: Obstetrics and Gynecology

## 2021-08-17 ENCOUNTER — Encounter (HOSPITAL_BASED_OUTPATIENT_CLINIC_OR_DEPARTMENT_OTHER): Payer: Self-pay | Admitting: Obstetrics and Gynecology

## 2021-08-17 ENCOUNTER — Ambulatory Visit (HOSPITAL_BASED_OUTPATIENT_CLINIC_OR_DEPARTMENT_OTHER): Payer: Self-pay | Admitting: Anesthesiology

## 2021-08-17 DIAGNOSIS — R87810 Cervical high risk human papillomavirus (HPV) DNA test positive: Secondary | ICD-10-CM

## 2021-08-17 DIAGNOSIS — D069 Carcinoma in situ of cervix, unspecified: Secondary | ICD-10-CM

## 2021-08-17 DIAGNOSIS — I10 Essential (primary) hypertension: Secondary | ICD-10-CM | POA: Insufficient documentation

## 2021-08-17 DIAGNOSIS — R87610 Atypical squamous cells of undetermined significance on cytologic smear of cervix (ASC-US): Secondary | ICD-10-CM

## 2021-08-17 DIAGNOSIS — R87619 Unspecified abnormal cytological findings in specimens from cervix uteri: Secondary | ICD-10-CM

## 2021-08-17 HISTORY — PX: CERVICAL CONIZATION W/BX: SHX1330

## 2021-08-17 SURGERY — CONE BIOPSY, CERVIX
Anesthesia: General | Site: Cervix

## 2021-08-17 MED ORDER — OXYCODONE HCL 5 MG PO TABS: 5.0000 mg | ORAL_TABLET | Freq: Once | ORAL | Status: DC | PRN

## 2021-08-17 MED ORDER — BUPIVACAINE HCL (PF) 0.25 % IJ SOLN
INTRAMUSCULAR | Status: AC
Start: 2021-08-17 — End: ?
  Filled 2021-08-17: qty 30

## 2021-08-17 MED ORDER — KETOROLAC TROMETHAMINE 15 MG/ML IJ SOLN
INTRAMUSCULAR | Status: DC | PRN
  Administered 2021-08-17: 15 mg via INTRAVENOUS

## 2021-08-17 MED ORDER — KETOROLAC TROMETHAMINE 15 MG/ML IJ SOLN
15.0000 mg | INTRAMUSCULAR | Status: AC
  Administered 2021-08-17: 15 mg via INTRAVENOUS

## 2021-08-17 MED ORDER — OXYCODONE HCL 5 MG/5ML PO SOLN: 5.0000 mg | Freq: Once | ORAL | Status: DC | PRN

## 2021-08-17 MED ORDER — FENTANYL CITRATE (PF) 100 MCG/2ML IJ SOLN
INTRAMUSCULAR | Status: DC | PRN
Start: 2021-08-17 — End: 2021-08-17
  Administered 2021-08-17: 50 ug via INTRAVENOUS

## 2021-08-17 MED ORDER — FERRIC SUBSULFATE 259 MG/GM EX SOLN
CUTANEOUS | Status: AC
  Filled 2021-08-17: qty 8

## 2021-08-17 MED ORDER — KETOROLAC TROMETHAMINE 15 MG/ML IJ SOLN
INTRAMUSCULAR | Status: AC
  Filled 2021-08-17: qty 1

## 2021-08-17 MED ORDER — IODINE STRONG (LUGOLS) 5 % PO SOLN
ORAL | Status: AC
  Filled 2021-08-17: qty 1

## 2021-08-17 MED ORDER — MIDAZOLAM HCL 5 MG/5ML IJ SOLN
INTRAMUSCULAR | Status: DC | PRN
  Administered 2021-08-17: 2 mg via INTRAVENOUS

## 2021-08-17 MED ORDER — FENTANYL CITRATE (PF) 100 MCG/2ML IJ SOLN
INTRAMUSCULAR | Status: AC
Start: 2021-08-17 — End: ?
  Filled 2021-08-17: qty 2

## 2021-08-17 MED ORDER — KETOROLAC TROMETHAMINE 30 MG/ML IJ SOLN
INTRAMUSCULAR | Status: AC
  Filled 2021-08-17: qty 1

## 2021-08-17 MED ORDER — LACTATED RINGERS IV SOLN: INTRAVENOUS | Status: DC

## 2021-08-17 MED ORDER — SODIUM CHLORIDE 0.9 % IV SOLN
INTRAVENOUS | Status: AC
  Filled 2021-08-17: qty 2

## 2021-08-17 MED ORDER — PROPOFOL 10 MG/ML IV BOLUS
INTRAVENOUS | Status: DC | PRN
  Administered 2021-08-17: 200 mg via INTRAVENOUS

## 2021-08-17 MED ORDER — PROPOFOL 10 MG/ML IV BOLUS
INTRAVENOUS | Status: AC
  Filled 2021-08-17: qty 20

## 2021-08-17 MED ORDER — MIDAZOLAM HCL 2 MG/2ML IJ SOLN
INTRAMUSCULAR | Status: AC
  Filled 2021-08-17: qty 2

## 2021-08-17 MED ORDER — ACETAMINOPHEN 500 MG PO TABS
ORAL_TABLET | ORAL | Status: AC
  Filled 2021-08-17: qty 2

## 2021-08-17 MED ORDER — SILVER NITRATE-POT NITRATE 75-25 % EX MISC
CUTANEOUS | Status: AC
  Filled 2021-08-17: qty 10

## 2021-08-17 MED ORDER — OXYCODONE HCL 5 MG PO TABS: 5.0000 mg | ORAL_TABLET | Freq: Four times a day (QID) | ORAL | 0 refills | Status: DC | PRN

## 2021-08-17 MED ORDER — POVIDONE-IODINE 10 % EX SWAB
2.0000 "application " | Freq: Once | CUTANEOUS | Status: AC
  Administered 2021-08-17: 2 via TOPICAL

## 2021-08-17 MED ORDER — DEXAMETHASONE SODIUM PHOSPHATE 10 MG/ML IJ SOLN
INTRAMUSCULAR | Status: AC
  Filled 2021-08-17: qty 1

## 2021-08-17 MED ORDER — SOD CITRATE-CITRIC ACID 500-334 MG/5ML PO SOLN
30.0000 mL | ORAL | Status: AC
  Administered 2021-08-17: 30 mL via ORAL

## 2021-08-17 MED ORDER — SODIUM CHLORIDE 0.9 % IV SOLN
2.0000 g | INTRAVENOUS | Status: AC
  Administered 2021-08-17: 2 g via INTRAVENOUS

## 2021-08-17 MED ORDER — ONDANSETRON HCL 4 MG/2ML IJ SOLN
INTRAMUSCULAR | Status: AC
  Filled 2021-08-17: qty 2

## 2021-08-17 MED ORDER — DEXAMETHASONE SODIUM PHOSPHATE 4 MG/ML IJ SOLN
INTRAMUSCULAR | Status: DC | PRN
  Administered 2021-08-17: 8 mg via INTRAVENOUS

## 2021-08-17 MED ORDER — FENTANYL CITRATE (PF) 100 MCG/2ML IJ SOLN: 25.0000 ug | INTRAMUSCULAR | Status: DC | PRN

## 2021-08-17 MED ORDER — ACETAMINOPHEN 500 MG PO TABS
1000.0000 mg | ORAL_TABLET | ORAL | Status: AC
  Administered 2021-08-17: 1000 mg via ORAL

## 2021-08-17 MED ORDER — ONDANSETRON HCL 4 MG/2ML IJ SOLN
INTRAMUSCULAR | Status: DC | PRN
  Administered 2021-08-17: 4 mg via INTRAVENOUS

## 2021-08-17 MED ORDER — LIDOCAINE 2% (20 MG/ML) 5 ML SYRINGE
INTRAMUSCULAR | Status: DC | PRN
  Administered 2021-08-17: 60 mg via INTRAVENOUS

## 2021-08-17 MED ORDER — LIDOCAINE 2% (20 MG/ML) 5 ML SYRINGE
INTRAMUSCULAR | Status: AC
  Filled 2021-08-17: qty 5

## 2021-08-17 MED ORDER — BUPIVACAINE HCL (PF) 0.5 % IJ SOLN
INTRAMUSCULAR | Status: AC
  Filled 2021-08-17: qty 30

## 2021-08-17 MED ORDER — BUPIVACAINE HCL (PF) 0.25 % IJ SOLN
INTRAMUSCULAR | Status: DC | PRN
  Administered 2021-08-17: 10 mL

## 2021-08-17 MED ORDER — AMISULPRIDE (ANTIEMETIC) 5 MG/2ML IV SOLN: 10.0000 mg | Freq: Once | INTRAVENOUS | Status: DC | PRN

## 2021-08-17 MED ORDER — IBUPROFEN 800 MG PO TABS: 800.0000 mg | ORAL_TABLET | Freq: Three times a day (TID) | ORAL | 0 refills | Status: AC | PRN

## 2021-08-17 MED ORDER — SOD CITRATE-CITRIC ACID 500-334 MG/5ML PO SOLN
ORAL | Status: AC
  Filled 2021-08-17: qty 30

## 2021-08-17 SURGICAL SUPPLY — 27 items
APL SWBSTK 6 STRL LF DISP (MISCELLANEOUS) ×1
APPLICATOR COTTON TIP 6 STRL (MISCELLANEOUS) ×2 IMPLANT
APPLICATOR COTTON TIP 6IN STRL (MISCELLANEOUS) ×2
BLADE MINI RND TIP GREEN BEAV (BLADE) ×1 IMPLANT
BLADE SURG 11 STRL SS (BLADE) ×3 IMPLANT
CATH ROBINSON RED A/P 16FR (CATHETERS) ×1 IMPLANT
ELECT BALL LEEP 5MM RED (ELECTRODE) ×3 IMPLANT
ELECT REM PT RETURN 9FT ADLT (ELECTROSURGICAL) ×2
ELECTRODE REM PT RTRN 9FT ADLT (ELECTROSURGICAL) ×2 IMPLANT
GAUZE 4X4 16PLY ~~LOC~~+RFID DBL (SPONGE) ×3 IMPLANT
GLOVE SURG ENC MOIS LTX SZ7.5 (GLOVE) ×5 IMPLANT
GLOVE SURG UNDER POLY LF SZ7 (GLOVE) ×4 IMPLANT
GOWN STRL REUS W/ TWL LRG LVL3 (GOWN DISPOSABLE) ×2 IMPLANT
GOWN STRL REUS W/TWL LRG LVL3 (GOWN DISPOSABLE) ×6
GOWN STRL REUS W/TWL XL LVL3 (GOWN DISPOSABLE) ×2 IMPLANT
NS IRRIG 1000ML POUR BTL (IV SOLUTION) ×3 IMPLANT
PACK VAGINAL MINOR WOMEN LF (CUSTOM PROCEDURE TRAY) ×3 IMPLANT
PAD OB MATERNITY 4.3X12.25 (PERSONAL CARE ITEMS) ×3 IMPLANT
PAD PREP 24X48 CUFFED NSTRL (MISCELLANEOUS) ×3 IMPLANT
PENCIL SMOKE EVACUATOR (MISCELLANEOUS) ×3 IMPLANT
SCOPETTES 8  STERILE (MISCELLANEOUS) ×2
SCOPETTES 8 STERILE (MISCELLANEOUS) ×2 IMPLANT
SLEEVE SCD COMPRESS KNEE MED (STOCKING) ×3 IMPLANT
SUT VIC AB PLUS 45CM 1-MO-4 (SUTURE) ×3 IMPLANT
TOWEL GREEN STERILE FF (TOWEL DISPOSABLE) ×3 IMPLANT
TUBE CONNECTING 20X1/4 (TUBING) ×6 IMPLANT
YANKAUER SUCT BULB TIP NO VENT (SUCTIONS) ×3 IMPLANT

## 2021-08-17 NOTE — Anesthesia Postprocedure Evaluation (Signed)
Anesthesia Post Note ? ?Patient: Joy Chen ? ?Procedure(s) Performed: COLD KNIFE CONIZATION CERVIX WITH BIOPSY (Cervix) ? ?  ? ?Patient location during evaluation: PACU ?Anesthesia Type: General ?Level of consciousness: awake ?Pain management: pain level controlled ?Vital Signs Assessment: post-procedure vital signs reviewed and stable ?Respiratory status: spontaneous breathing, nonlabored ventilation, respiratory function stable and patient connected to nasal cannula oxygen ?Cardiovascular status: blood pressure returned to baseline and stable ?Postop Assessment: no apparent nausea or vomiting ?Anesthetic complications: no ? ? ?No notable events documented. ? ?Last Vitals:  ?Vitals:  ? 08/17/21 1130 08/17/21 1230  ?BP: (!) 145/92 (!) 161/93  ?Pulse: 64 62  ?Resp: 15 16  ?Temp:  36.7 ?C  ?SpO2: 98% 100%  ?  ?Last Pain:  ?Vitals:  ? 08/17/21 1230  ?TempSrc:   ?PainSc: 0-No pain  ? ? ?  ?  ?  ?  ?  ?  ? ?Faigy Stretch P Nam Vossler ? ? ? ? ?

## 2021-08-17 NOTE — Interval H&P Note (Signed)
History and Physical Interval Note: ? ?08/17/2021 ?9:30 AM ? ?Joy Chen  has presented today for surgery, with the diagnosis of CIN3.  The various methods of treatment have been discussed with the patient and family. After consideration of risks, benefits and other options for treatment, the patient has consented to  Procedure(s): ?COLD KNIFE CONIZATION CERVIX WITH BIOPSY (N/A) as a surgical intervention.  The patient's history has been reviewed, patient examined, no change in status, stable for surgery.  I have reviewed the patient's chart and labs.  Questions were answered to the patient's satisfaction.   ? ? ?[name] ? ? ?

## 2021-08-17 NOTE — Anesthesia Procedure Notes (Signed)
Procedure Name: LMA Insertion ?Date/Time: 08/17/2021 10:10 AM ?Performed by: Alford Highland, CRNA ?Pre-anesthesia Checklist: Patient identified, Emergency Drugs available, Suction available and Patient being monitored ?Patient Re-evaluated:Patient Re-evaluated prior to induction ?Oxygen Delivery Method: Circle System Utilized ?Preoxygenation: Pre-oxygenation with 100% oxygen ?Induction Type: IV induction ?Ventilation: Mask ventilation without difficulty ?LMA: LMA inserted ?LMA Size: 3.0 ?Number of attempts: 1 ?Airway Equipment and Method: Bite block ?Placement Confirmation: positive ETCO2 ?Tube secured with: Tape ?Dental Injury: Teeth and Oropharynx as per pre-operative assessment  ? ? ? ? ?

## 2021-08-17 NOTE — Op Note (Signed)
Preoperative diagnosis: CIN3/CIS ? ?Postoperative diagnosis: Same ? ?Procedure: Cervical conization ? ?Surgeon: Nettie Elm, MD ? ?Anesthesia: GETA - Ellender, Catheryn Bacon, MD ? ?Findings: Previous colpo Bx of CIN 3 and CIS ? ?Estimated blood loss: 20 cc ? ?Specimens: Cervical conization ? ?Reason for procedure: Royston Bake No obstetric history on file. with h/o CIN 3 and CIS. ? ?Procedure: Patient was taken to the operating room where  analgesia was administered.She was prepped and draped in the usual sterile fashion. A timeout was performed. The patient had SCDs in place. The patient was in dorsal lithotomy.  A weighted speculum was placed inside the vagina.  A Deaver was used anteriorly. The cervix was grasped with a single tooth tenaculum. A paracervical block was performed with 0.25% marcaine. A 0 Vicryl suture on a CT-1 was used to put stay sutures in cervix from 10-8 and from 2-4 o'clock.  An 11 blade was used to take a 3 cm conization of cervix. ECC was obtained as well.  The electrocautery ball was used at the base.  The conization site was closed in a Stordoff fashion. Endocervical canal was verified to be patent with passage of uterine sound. Hemostasis was noted.  All instrument, needle and lap counts were correct x 2. The patient was taken to recovery in stable condition. ? ?She was discharged home from PACU with prescriptions of Motrin and Percocet. F/U in the office  in 4 weeks. ? ?Christipher Rieger L. Alysia Penna, MD  ?

## 2021-08-17 NOTE — Transfer of Care (Signed)
Immediate Anesthesia Transfer of Care Note ? ?Patient: Joy Chen ? ?Procedure(s) Performed: COLD KNIFE CONIZATION CERVIX WITH BIOPSY (Cervix) ? ?Patient Location: PACU ? ?Anesthesia Type:General ? ?Level of Consciousness: drowsy ? ?Airway & Oxygen Therapy: Patient Spontanous Breathing and Patient connected to face mask oxygen ? ?Post-op Assessment: Report given to RN and Post -op Vital signs reviewed and stable ? ?Post vital signs: Reviewed and stable ? ?Last Vitals:  ?Vitals Value Taken Time  ?BP    ?Temp    ?Pulse 74 08/17/21 1047  ?Resp 12 08/17/21 1047  ?SpO2 99 % 08/17/21 1047  ?Vitals shown include unvalidated device data. ? ?Last Pain:  ?Vitals:  ? 08/17/21 0752  ?TempSrc: Oral  ?PainSc: 0-No pain  ?   ? ?  ? ?Complications: No notable events documented. ?

## 2021-08-17 NOTE — Discharge Instructions (Signed)
No Tylenol before 2:00pm if needed. ?No ibuprofen 4:30pm if needed. ? ?Post Anesthesia Home Care Instructions ? ?Activity: ?Get plenty of rest for the remainder of the day. A responsible individual must stay with you for 24 hours following the procedure.  ?For the next 24 hours, DO NOT: ?-Drive a car ?-Advertising copywriter ?-Drink alcoholic beverages ?-Take any medication unless instructed by your physician ?-Make any legal decisions or sign important papers. ? ?Meals: ?Start with liquid foods such as gelatin or soup. Progress to regular foods as tolerated. Avoid greasy, spicy, heavy foods. If nausea and/or vomiting occur, drink only clear liquids until the nausea and/or vomiting subsides. Call your physician if vomiting continues. ? ?Special Instructions/Symptoms: ?Your throat may feel dry or sore from the anesthesia or the breathing tube placed in your throat during surgery. If this causes discomfort, gargle with warm salt water. The discomfort should disappear within 24 hours. ? ?If you had a scopolamine patch placed behind your ear for the management of post- operative nausea and/or vomiting: ? ?1. The medication in the patch is effective for 72 hours, after which it should be removed.  Wrap patch in a tissue and discard in the trash. Wash hands thoroughly with soap and water. ?2. You may remove the patch earlier than 72 hours if you experience unpleasant side effects which may include dry mouth, dizziness or visual disturbances. ?3. Avoid touching the patch. Wash your hands with soap and water after contact with the patch. ?    ?

## 2021-08-17 NOTE — Anesthesia Preprocedure Evaluation (Addendum)
Anesthesia Evaluation  ?Patient identified by MRN, date of birth, ID band ?Patient awake ? ? ? ?Reviewed: ?Allergy & Precautions, NPO status , Patient's Chart, lab work & pertinent test results ? ?Airway ?Mallampati: II ? ?TM Distance: >3 FB ?Neck ROM: Full ? ? ? Dental ?no notable dental hx. ? ?  ?Pulmonary ?neg pulmonary ROS,  ?  ?Pulmonary exam normal ?breath sounds clear to auscultation ? ? ? ? ? ? Cardiovascular ?hypertension, Pt. on medications ?Normal cardiovascular exam ?Rhythm:Regular Rate:Normal ? ? ?  ?Neuro/Psych ?negative neurological ROS ? negative psych ROS  ? GI/Hepatic ?negative GI ROS, Neg liver ROS,   ?Endo/Other  ?negative endocrine ROS ? Renal/GU ?negative Renal ROS  ? ?  ?Musculoskeletal ?negative musculoskeletal ROS ?(+)  ? Abdominal ?  ?Peds ? Hematology ?negative hematology ROS ?(+)   ?Anesthesia Other Findings ?CIN3 ? Reproductive/Obstetrics ?hcg negative ? ?  ? ? ? ? ? ? ? ? ? ? ? ? ? ?  ?  ? ? ? ? ? ? ? ?Anesthesia Physical ?Anesthesia Plan ? ?ASA: 2 ? ?Anesthesia Plan: General  ? ?Post-op Pain Management:   ? ?Induction: Intravenous ? ?PONV Risk Score and Plan: 3 and Ondansetron, Dexamethasone, Midazolam and Treatment may vary due to age or medical condition ? ?Airway Management Planned: LMA ? ?Additional Equipment:  ? ?Intra-op Plan:  ? ?Post-operative Plan: Extubation in OR ? ?Informed Consent: I have reviewed the patients History and Physical, chart, labs and discussed the procedure including the risks, benefits and alternatives for the proposed anesthesia with the patient or authorized representative who has indicated his/her understanding and acceptance.  ? ? ? ?Dental advisory given and Interpreter used for interveiw ? ?Plan Discussed with: CRNA ? ?Anesthesia Plan Comments:   ? ? ? ? ?Anesthesia Quick Evaluation ? ?

## 2021-08-20 ENCOUNTER — Encounter (HOSPITAL_BASED_OUTPATIENT_CLINIC_OR_DEPARTMENT_OTHER): Payer: Self-pay | Admitting: Obstetrics and Gynecology

## 2021-08-20 LAB — SURGICAL PATHOLOGY

## 2021-08-21 ENCOUNTER — Ambulatory Visit: Payer: Self-pay | Admitting: Obstetrics and Gynecology

## 2021-08-24 ENCOUNTER — Telehealth: Payer: Self-pay

## 2021-08-24 NOTE — Telephone Encounter (Signed)
Via Annice Pih, [place] Interpreter [facility] Interpreter Service Hub) Patient was contacted to verify [place] citizenship to determine if eligible to apply for [employer] Medicaid to help cover LEEP procedure. Patient stated that she was not a [place] citizen. [facility] Financial Assistance Application mailed to patient.  ?

## 2021-09-10 ENCOUNTER — Telehealth: Payer: Self-pay | Admitting: General Practice

## 2021-09-10 NOTE — Telephone Encounter (Signed)
Attempted to call patient with Joy Chen assisting with spanish interpretation to discuss her bleeding, no answer- left message asking her to return our phone call. ?

## 2021-09-13 ENCOUNTER — Ambulatory Visit (INDEPENDENT_AMBULATORY_CARE_PROVIDER_SITE_OTHER): Payer: Self-pay | Admitting: Obstetrics and Gynecology

## 2021-09-13 ENCOUNTER — Encounter: Payer: Self-pay | Admitting: Obstetrics and Gynecology

## 2021-09-13 DIAGNOSIS — D069 Carcinoma in situ of cervix, unspecified: Secondary | ICD-10-CM | POA: Insufficient documentation

## 2021-09-13 NOTE — Patient Instructions (Signed)

## 2021-09-13 NOTE — Progress Notes (Signed)
Pt states she has inconsistent pain in her lower right quad. Pt also states she is having bleeding with clots.bleeding started to slow down 09/11/21. ?

## 2021-09-13 NOTE — Progress Notes (Signed)
Ms Genia Hotter is her for post op appt form [facility]. ?Pt reports some bleeding  ?Denies any bowel or bladder dysfunction ? ?Pathology reviewed with pt. ? ?PE AF VSS ?Chaperone present during exam ? ?Lungs clear Heart RRR ?Abd soft + BS ?GU  cervical conization site healing well, monsels applied ? ? ?A/P Post op [facility] ?       Adenocarcinoma in Situ with clear margins ? ?Will refer to GYN onc for further eval. Reviewed with pt. ?Live interrupter used during today's visit.  ?

## 2021-10-17 ENCOUNTER — Telehealth: Payer: Self-pay

## 2021-10-17 NOTE — Telephone Encounter (Signed)
VM left on nurse line by Marylene Land RN with [facility]. States this is a mutual patient who she saw recently and is inquiring about status of gyn/onc referral. Marylene Land RN can be reached at [phone].

## 2021-10-17 NOTE — Telephone Encounter (Addendum)
Called [facility] to Schedule appointment. LM for Molli Knock that referral is in and patient needs to be scheduled.    Returned call to Marylene Land to let her know that we are working on getting patient schedule and will reach out to patient once it is scheduled.

## 2021-10-18 ENCOUNTER — Telehealth: Payer: Self-pay | Admitting: *Deleted

## 2021-10-18 NOTE — Telephone Encounter (Signed)
Spoke with the patient via [employer] (interpeter ID [phone]) patient given the date and time for the new patient appt; along with the address and phone number for the clinic

## 2021-11-01 ENCOUNTER — Telehealth: Payer: Self-pay

## 2021-11-01 ENCOUNTER — Encounter: Payer: Self-pay | Admitting: Gynecologic Oncology

## 2021-11-01 NOTE — Telephone Encounter (Signed)
I spoke to patient via [facility] interpreters Mikle Bosworth [phone]. Meaningful Use was updated for upcoming appointment on 11/05/21. Pt asked if appointment could be rescheduled to another day, preferably a Friday, due to she has another appointment on 6/19. Pt is now scheduled for 11/23/21 at 10:30

## 2021-11-05 ENCOUNTER — Ambulatory Visit: Payer: Self-pay | Admitting: Gynecologic Oncology

## 2021-11-22 NOTE — Progress Notes (Signed)
GYNECOLOGIC ONCOLOGY NEW PATIENT CONSULTATION   Patient Name: Joy Chen  Patient Age: 41 y.o. 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  Margins of her cone were negative for either dysplasia or AIS and her ECC was also negative.  The patient voiced some uncertainty with regard to fertility desires.  She initially said that she did not think she wanted to have more children but ultimately would like to talk to her partner about this decision.  We discussed that per ASCCP guidelines, definitive surgery with a hysterectomy is recommended in the setting of adenocarcinoma in situ if no further fertility is desired.  Given negative margins on her cone and negative post cone ECC, if the patient decides she would like to preserve her fertility, then close follow-up is reasonable.  This would include exams every 6 months for the first 3 years with cotesting and ECC; followed by at least 2 additional years of yearly cotesting and ECC.  Ideally, once the patient has decided she no longer desires to preserve her fertility or has finished with additional childbearing, she would undergo total hysterectomy.  The patient is currently uninsured.  She was interested in talking to one of our financial counselors about options for payment plans.  There was no one present in the cancer center today to speak with her but they offered to arrange phone follow-up.  I suspect that the cost of surgery may be more with me.  If the patient  decides to move forward with total hysterectomy now, or when she is ready to at some point in the future, I think it would be reasonable for her to have the surgery with her OB/GYN if this is going to be less costly for her.  I have asked her to let me know after she talks to her partner about this decision.  I reviewed that even after definitive hysterectomy, she will need close follow-up in the setting of her adenocarcinoma in situ history.  With regard to her upper extremity rash, I have encouraged her to follow-up with the health center where she gets her primary care.  I do not think this is related to her cervical dysplasia/adenocarcinoma in situ.  A copy of this note was sent to the patient's referring provider.   45 minutes of total time was spent for this patient encounter, including preparation, face-to-face counseling with the patient and coordination of care, and documentation of the encounter.   Jeral Pinch, MD  Division of Gynecologic Oncology  Department of Obstetrics and Gynecology  [facility]  ___________________________________________  Chief Complaint: Chief Complaint  Patient presents with   Adenocarcinoma in situ (AIS) of uterine cervix    History of Present Illness:  Joy Chen is a 41 y.o. y.o. female who is seen in consultation at the request of Chancy Milroy, MD for an evaluation of high-grade dysplasia and adenocarcinoma in situ.  Patient reports history of normal Pap smears until most recent Pap earlier this year.  She thinks that her Pap smear prior to January 2023 was 2 or 3  years previously.  Her recent history as noted below: 06/11/21: ASC-H pap. Atypical endocervical cells. + HR HPV. 07/24/21: Colposcopy with 2 cervical biopsies and ECC. Biopsies showed CIN3/CIS. ECC showed CIN3. EMB also performed and showed secretory endometrium, no hyperplasia or malignancy. 08/17/21: CKC. CIN2 and AIS. No invasion. Margins free  of dysplasia and AIS. ECC with benign endocervix and LUS.   Patient reports doing well after her procedure.  She had bleeding for about 30 days.  She has now had 2 normal menses, last was on 24 June.  She denies any pelvic cramping.  She has occasional "tension" like feeling after surgery, in the right lower quadrant.  She also has intermittent right low back pain.  She endorses a good appetite without nausea or emesis.  She reports normal bowel and bladder function.  She gets occasional mild headaches when she gets anxious.  This was previously related to her blood pressure, which is better controlled recently.  She checks her blood pressure at home and normal systolics range between 130s and 140s.  Patient is scheduled for repeat breast ultrasound in August secondary to some breast cysts being followed that were seen initially on mammogram in February.  Patient endorses history of regular periods without any intermenstrual bleeding.  PAST MEDICAL HISTORY:  Past Medical History:  Diagnosis Date   Hypertension      PAST SURGICAL HISTORY:  Past Surgical History:  Procedure Laterality Date   CERVICAL CONIZATION W/BX N/A 08/17/2021   Procedure: COLD KNIFE CONIZATION CERVIX WITH BIOPSY;  Surgeon: [name], MD;  Location: [facility];  Service: Gynecology;  Laterality: N/A;    OB/GYN HISTORY:  OB History  Gravida Para Term Preterm AB Living  2 2       2   SAB IAB Ectopic Multiple Live Births               # Outcome Date GA Lbr Len/2nd Weight Sex Delivery Anes PTL Lv  2 Para           1 Para             No LMP recorded. (Menstrual status: Other).  Age at menopause: Not applicable Hx of HRT: Not applicable Hx of STDs: HPV+ Last pap: see HPI History of abnormal pap smears: see HPI  SCREENING STUDIES:  Last mammogram: 2023  Last colonoscopy: has never had  MEDICATIONS: Outpatient Encounter Medications as of 11/23/2021  Medication Sig   ibuprofen (ADVIL) 800  MG tablet Take 1 tablet (800 mg total) by mouth every 8 (eight) hours as needed.   lisinopril (ZESTRIL) 10 MG tablet Take 10 mg by mouth daily.   No facility-administered encounter medications on file as of 11/23/2021.    ALLERGIES:  No Known Allergies   FAMILY HISTORY:  Family History  Problem Relation Age of Onset   Heart disease Mother    Cancer Neg Hx    Diabetes Neg Hx    Heart failure Neg Hx    Hyperlipidemia Neg Hx    Hypertension Neg Hx      SOCIAL HISTORY:  Social Connections: Not on file    REVIEW OF SYSTEMS:  + vision problems, rash, back pain. Denies appetite changes, fevers, chills, fatigue, unexplained weight changes. Denies hearing loss, neck lumps or masses, mouth sores, ringing in ears or voice changes. Denies cough or wheezing.  Denies shortness of breath. Denies chest pain or palpitations. Denies leg swelling. Denies abdominal distention, pain, blood in stools, constipation, diarrhea,  nausea, vomiting, or early satiety. Denies pain with intercourse, dysuria, frequency, hematuria or incontinence. Denies hot flashes, pelvic pain, vaginal bleeding or vaginal discharge.   Denies joint pain or muscle pain/cramps. Denies itching or wounds. Denies dizziness, headaches, numbness or seizures. Denies swollen lymph nodes or glands, denies easy bruising or bleeding. Denies anxiety, depression, confusion, or decreased concentration.  Physical Exam:  Vital Signs for this encounter:  Blood pressure 131/90, pulse 70, temperature 98.4 F (36.9 C), temperature source Oral, resp. rate 16, height 4\' 10"  (1.473 m), weight 149 lb 14.4 oz (68 kg), SpO2 100 %. Body mass index is 31.33 kg/m. General: Alert, oriented, no acute distress.  HEENT: Normocephalic, atraumatic. Sclera anicteric.  Chest: Clear to auscultation bilaterally. No wheezes, rhonchi, or rales. Cardiovascular: Regular rate and rhythm, no murmurs, rubs, or gallops.  Abdomen: Normoactive bowel sounds. Soft,  nondistended, nontender to palpation. No masses or hepatosplenomegaly appreciated. No palpable fluid wave.  Extremities: Grossly normal range of motion. Warm, well perfused. No edema bilaterally.  Skin: Small areas of crusted excoriations on bilateral arms. Lymphatics: No cervical, supraclavicular, or inguinal adenopathy.  GU:  Normal external female genitalia. No lesions. No discharge or bleeding.             Bladder/urethra:  No lesions or masses, well supported bladder             Vagina: Well rugated, no lesions.             Cervix: Normal appearing, no lesions.  Has healed well since her cone.  On bimanual exam, there is less posterior cervix, somewhat flush with the vaginal apex.  No nodularity or firmness of the cervix noted.             Uterus:  10cm, mobile, no parametrial involvement or nodularity.             Adnexa: No masses appreciated.  Rectal: Deferred.  LABORATORY AND RADIOLOGIC DATA:  Outside medical records were reviewed to synthesize the above history, along with the history and physical obtained during the visit.   Lab Results  Component Value Date   WBC 8.8 08/16/2021   HGB 12.8 08/16/2021   HCT 37.3 08/16/2021   PLT 320 08/16/2021   GLUCOSE 77 08/16/2021   ALT 39 (H) 12/29/2008   AST 33 12/29/2008   NA 138 08/16/2021   K 3.6 08/16/2021   CL 107 08/16/2021   CREATININE 0.68 08/16/2021   BUN 13 08/16/2021   CO2 24 08/16/2021   Pelvic ultrasound exam on 08/07/21: FINDINGS: Uterus Measurements: 9.9 x 3.9 x 5.1 cm = volume: 103.5 mL. No fibroids or other mass visualized. Endometrium Thickness: 10 mm.  No focal abnormality visualized. Right ovary Measurements: 2.4 x 2.6 x 2.0 cm = volume: 6.5 mL. Normal appearance/no adnexal mass. Left ovary Measurements: 2.5 x 1.0 x 1.6 cm = volume: 2.1 mL. Normal appearance/no adnexal mass. Other findings No abnormal free fluid. IMPRESSION: Normal transabdominal and endovaginal pelvic ultrasound.

## 2021-11-23 ENCOUNTER — Other Ambulatory Visit: Payer: Self-pay

## 2021-11-23 ENCOUNTER — Encounter: Payer: Self-pay | Admitting: Gynecologic Oncology

## 2021-11-23 ENCOUNTER — Inpatient Hospital Stay: Payer: Self-pay | Attending: Gynecologic Oncology | Admitting: Gynecologic Oncology

## 2021-11-23 VITALS — BP 131/90 | HR 70 | Temp 98.4°F | Resp 16 | Ht <= 58 in | Wt 149.9 lb

## 2021-11-23 DIAGNOSIS — Z79899 Other long term (current) drug therapy: Secondary | ICD-10-CM | POA: Insufficient documentation

## 2021-11-23 DIAGNOSIS — I10 Essential (primary) hypertension: Secondary | ICD-10-CM | POA: Insufficient documentation

## 2021-11-23 DIAGNOSIS — B977 Papillomavirus as the cause of diseases classified elsewhere: Secondary | ICD-10-CM

## 2021-11-23 DIAGNOSIS — Z6831 Body mass index (BMI) 31.0-31.9, adult: Secondary | ICD-10-CM | POA: Insufficient documentation

## 2021-11-23 DIAGNOSIS — D069 Carcinoma in situ of cervix, unspecified: Secondary | ICD-10-CM

## 2021-11-23 DIAGNOSIS — N871 Moderate cervical dysplasia: Secondary | ICD-10-CM | POA: Insufficient documentation

## 2021-11-23 NOTE — Progress Notes (Signed)
Received call from [place] in GYN regarding patient and insurance. Researched and followed up as below.  Hi all.I am following up on this patient and insurance. Patient is not a [place] citizen therefore she was found ineligible for Medicaid.   She applied for Financial Assistance through [facility] and was denied due to failure to supply additional requested documentation. Unfortunately, the denial stands for 6 months and therefore she is not eligible to reapply until 02/03/22.  She will receive the automatic uninsured discount of 60% for all services billed through [facility] which applies to all uninsured patients. Any bills outside of [facility], she would have to make arrangements through the provider directly.  She will be eligible for the [employer] which can help with oral prescription medication prescribed by our providers if needed and other personal household expenses while in treatment.  I will have Raynelle Fanning communicate this to patient.  I am also copying the social workers for any additional resources if available.

## 2021-11-23 NOTE — Patient Instructions (Signed)
It was very nice to meet you today.  You discussed with your partner, please call me if you decide that you have finished with childbearing and would like to move forward with scheduling hysterectomy.  While you can have this with me, I think it would be very reasonable for you to have the surgery with your OB/GYN, especially if the cost will be last.  If you are not ready to make a decision about removing your uterus (which will mean that you would not be able to get pregnant in the future), then I will recommend to your OB/GYN that you have close follow-up.  This will mean visits every 6 months initially where you will have an exam, Pap test, and biopsy from your cervix.  Ultimately, when you have finished with your family-planning and do not desire any future pregnancies, I recommend undergoing total hysterectomy (removing your uterus and cervix).

## 2022-01-11 ENCOUNTER — Ambulatory Visit
Admission: RE | Admit: 2022-01-11 | Discharge: 2022-01-11 | Disposition: A | Discharge status: Home / Self Care | Payer: No Typology Code available for payment source | Source: Ambulatory Visit | Attending: Obstetrics and Gynecology | Admitting: Obstetrics and Gynecology

## 2022-01-11 ENCOUNTER — Other Ambulatory Visit: Payer: Self-pay | Admitting: Obstetrics and Gynecology

## 2022-01-11 DIAGNOSIS — N6325 Unspecified lump in the left breast, overlapping quadrants: Secondary | ICD-10-CM

## 2022-03-29 ENCOUNTER — Other Ambulatory Visit: Payer: Self-pay

## 2022-03-29 ENCOUNTER — Ambulatory Visit (INDEPENDENT_AMBULATORY_CARE_PROVIDER_SITE_OTHER): Payer: No Typology Code available for payment source | Admitting: Obstetrics and Gynecology

## 2022-03-29 ENCOUNTER — Encounter: Payer: Self-pay | Admitting: Obstetrics and Gynecology

## 2022-03-29 VITALS — BP 166/102 | HR 65 | Wt 146.8 lb

## 2022-03-29 DIAGNOSIS — I10 Essential (primary) hypertension: Secondary | ICD-10-CM

## 2022-03-29 DIAGNOSIS — D069 Carcinoma in situ of cervix, unspecified: Secondary | ICD-10-CM

## 2022-03-29 NOTE — Patient Instructions (Signed)
AREA PEDIATRIC/FAMILY PRACTICE PHYSICIANS  Central/[place]outheast [place] ([address]) [facility] Chambliss, MD; Eniola, MD; Hale, MD; Hensel, MD; McDiarmid, MD; McIntyer, MD; Phillip Sandler, MD; Walden, MD [address] [place], [place] [address] [phone] Mon-Fri 8:30-12:30, 1:30-5:00 Providers come to see babies at [facility] Accepting Medicaid [facility] providers who accept newborns: Koirala, MD; Morrow, MD; Wolters, MD [address], [place], [place] [address] [phone] Mon-Fri 8:00-5:30 Babies seen by providers at [facility] Does NOT accept Medicaid Please call early in hospitalization for appointment (limited availability)  [facility] Mulberry, MD [address] [place], [place] [address] [phone] Mon, Tue, Thur, Fri 8:30-5:00, Wed 10:00-7:00 (closed 1-2pm) Babies seen by [facility] providers Accepting Medicaid Rubin - Pediatrician Rubin, MD [address], [place], [place] [address] [phone] Mon-Fri 8:30-5:00, Sat 8:30-12:00 Provider comes to see babies at [facility] Accepting Medicaid Must have been referred from current patients or contacted office prior to delivery [facility] ([facility]) Brown, MD; Chandler, MD; Ettefagh, MD; Grant, MD; Lester, MD; McCormick, MD; McQueen, MD; Prose, MD; Simha, MD; Stanley, MD; Stryffeler, NP; Tebben, NP [address], [place], [place] [address] [phone] Mon, Tue, Thur, Fri 8:30-5:30, Wed 9:30-5:30, Sat 8:30-12:30 Babies seen by [facility] providers Accepting Medicaid Only accepting infants of first-time parents or siblings of current patients Hospital discharge coordinator will make follow-up appointment Jack Amos [address], [place], [place]  [address] [phone]   Fax - [phone] [facility] [address], [place], [place]  [address] Phone - [phone]   Fax - [phone] Shilpa Gosrani [address], [place], [place]  [address] [phone]  [place]ortheast [place] ([address]) [facility] Bates, MD; Brassfield, MD; Cooper, Cox, MD; MD; Davis, MD; Dovico, MD; Ettefaugh, MD; Little, MD; Lowe, MD; Keiffer, MD; Melvin, MD; Sumner, MD; Williams, MD [address], [place], [place] [address] [phone] Mon-Fri 8:30-5:00 (extended evenings Mon-Thur as needed), Sat-Sun 10:00-1:00 Providers come to see babies at [facility] Accepting Medicaid for families of first-time babies and families with all children in the household age 3 and under. Must register with office prior to making appointment (M-F only). [facility] Henson, NP; Knapp, MD; Lalonde, MD; Tysinger, PA [address] [place], [place] [address] [phone] Mon-Fri 8:00-5:00 Babies seen by providers at [facility] Does NOT accept Medicaid/Commercial Insurance Only [facility] ([facility])  Artis, MD; Barnes, MD; Bratton, MD; Coccaro, MD; [place] Gardner, MD; Kramer, MD; Marshall, MD; Netherton, MD; Poleto, MD; Skinner, MD [address] [place], [place] [address] [phone] Mon-Fri 8:30-5:30, Sat (Oct.-Mar.) 9:00-1:00 Babies seen by providers at [facility] Accepting Medicaid  [place] ([address]) [facility] Reid, MD; Warner, MD [address], [place],  [address] [phone] Mon-Fri 8:30-5:00, Sat 8:30-12:00 Providers come to see babies at [facility] Does NOT accept Medicaid [facility] Becker, PA; Hagler, MD; Scifres, PA; Sun, MD; Swayne, MD [address], [place],  [address] [phone] Mon-Fri 8:00-5:00 Babies seen by providers at [facility] Does NOT accept Medicaid Only accepting babies of parents who  are patients Please call early in hospitalization for appointment (limited availability) [facility] Clark, MD; Frye, MD; Kelleher, MD; Mack, NP; Miller, MD; O'Keller, MD; Patterson, NP; Pudlo, MD; Puzio, MD; Thomas, MD; Tucker, MD; Twiselton, MD [address], [place], [place] [address] [phone] Mon-Fri 8:00-5:00, Sat 9:00-12:00 Providers come to see babies at [facility] Does NOT accept Medicaid  [place] ([address]) [facility] providers accepting new patients: Brake, NP; Wharton, PA [address], [place], [place] [address] [phone] Mon-Fri 8:00-5:00 Babies seen by providers at [facility] Does NOT accept Medicaid Only accepting babies of parents who are patients Please call early in hospitalization for appointment (limited availability) [facility] Gay, MD; Quinlan, MD [address] [place], [place] [address] [phone] (press [phone] to schedule appointment) Mon-Fri 8:00-5:00 Providers come to see babies at [facility] Does NOT accept Medicaid [facility] Mazer, MD [address] [place], [place] [address] [phone] Mon-Fri 8:30-5:00 (lunch 12:30-1:00), extended hours by appointment only Wed 5:00-6:30 Babies seen by [facility] providers Accepting Medicaid [facility] Banks, MD; Jordan, MD; Koberlein, MD [address], [place], [place] [address] [phone] Mon-Fri 8:00-5:00 Babies seen by [facility] providers Does NOT accept Medicaid [facility] Parker, MD; Hunter, MD; Wallace, DO [address] [place], [place] [address] [phone] Mon-Fri 8:00-5:00 Babies seen by [facility] providers Does NOT accept Medicaid [facility] Brandon, PA; Brecken, PA; Christy, NP; Dees, MD; DeClaire, MD; DeWeese, MD; Hansen, NP; Mills, NP; Parrish, NP; Smoot, NP; Summer, MD; Vapne,  MD [address] [place], [place] [address] [phone] Mon-Fri 8:30-5:00, Sat 10:00-1:00 Providers come to see babies at [facility] Does NOT accept Medicaid Free prenatal information session Tuesdays at 4:45pm [facility] Bouska, MD; Gordon, PA; Jeffery, PA; Weber, PA [address] [place] [address] [phone] Mon-Fri 7:30-5:30 Babies seen by [facility] providers [facility] [address], [place], [place]  [address] [phone]   Fax - [phone]  [place] ([address] & [address]) [facility] [place] [address] [place], [place] [address] [phone] Mon-Thur 8:00-6:00 Providers come to see babies at [facility] Accepting Medicaid [facility] Anderson, NP; Badger, MD; Beal, PA; Spencer, PA [address] [place], [place] [address] [phone] Mon-Thur 7:30-7:30, Fri 7:30-4:30 Babies seen by [facility] providers Accepting Medicaid [facility] Agbuya, MD; Klett, NP; Romgoolam, MD [address], [place], [place] [address] [phone] Mon-Fri 8:30-5:00, Sat 8:30-12:00 Providers come to see babies at [facility] Accepting Medicaid Must have "Meet & Greet" appointment at office prior to delivery [facility] ([facility]) McCord, MD; Wallace, MD; Wood, MD [address], [place], [place] [address] [phone] Mon-Wed 8:00-6:00, Thur-Fri 8:00-5:00, Sat 9:00-12:00 Providers come to see babies at [facility] Does NOT accept Medicaid Only accepting siblings of current patients [facility]  [address], [place], [place]  [address] [phone]   Fax - [phone] [facility] [address], [place], [place]  [address] [phone]   Fax -  [phone]  [place] ([address] & [address]) [facility] Cirigliano, DO; Matthews, DO [address] [place], [place] [address] [phone] Mon-Fri 7:00-5:00 Babies seen by [facility] providers Does NOT accept Medicaid [facility] Briscoe, MD; Howley, PA; Moreira, PA [address], [place], [place] [address] [phone] Mon-Fri 8:00-5:00 Babies seen by [facility] providers Accepting Medicaid [facility] Boyd, MD; Church, PA; Jones, NP; Osborn, PA [address], ,  [address] [phone] Mon-Fri 8:00-5:00 Babies seen by providers at [facility] Accepting Medicaid  [place] ([address]) [facility] [place] Wendling, DO [address] [place], [place] [address] [phone] Mon-Fri 8:00-5:00 Babies seen by [facility] providers Does NOT accept Medicaid Limited availability, please call early in hospitalization to schedule follow-up [facility] Calderon, PA; Cummings, MD; Dillard, MD; Martin, PA; Olson, MD; VanDeven, PA [address], [place], [place] [address] [phone] Mon-Fri 8:30-5:00, Sat 9:00-12:00 Babies seen by providers at [facility] Accepting Medicaid Please register online then schedule online or call office [email or web address] Hunter, NP; Kumar, MD; Martin [place], PA [address], [place], [place] [address] [phone] Mon-Fri 8:00-5:00 Babies seen by providers at [facility] Accepting Medicaid [facility] ([facility]) [place], MD; Kristi Fleenor, NP; West, MD [address], [place], [place] [address] [phone] Mon-Fri 8:00-5:30, Sat&Sun by appointment (phones open at  8:30) Babies seen by [facility] providers Accepting Medicaid Must be a first-time baby or sibling of current patient [facility] [place]  [address], [place], [place]  [address] [phone]   Fax - [phone]  [place] ([address]) [facility] Brown, PA; Cowen, PA; Rice, MD; Helton, PA; Spry, MD [address] [place], [place] [address] [phone] Mon-Thur 8:00-7:00, Fri 8:00-5:00, Sat 8:00-12:00, Sun 9:00-12:00 Babies seen by [facility] providers Accepting Medicaid [facility] Coe-Goins, MD; Marshall, MD; Pierre-Louis, MD [address] [place] [address], [place], [place] [address] [phone] Mon-Thur 8:00-5:00 Babies seen by providers at [facility] Accepting Medicaid [facility] Bratton, MD; Coe-Goins, MD; Hayes, MD; Lewis, MD; List, MD; Lott, MD; Marshall, MD; Moran, MD; O'Janeil Schexnayder, MD; Pierre-Louis, MD; Pitonzo, MD; Scholer, MD; Spangle, MD [address] [place], [place] [address] [phone] Mon-Fri 8:00-5:30, Sat (Oct.-Mar.) 9:00-1:00 Babies seen by providers at [facility] Accepting Medicaid Must fill out new patient packet, available online at [email or web address] [facility] ([facility]) Friddle, NP; Harris, NP; Kelly, NP; Logan, MD; Melvin, PA; Poth, MD; Ramadoss, MD; Stanton, NP [address], [place], [place] [address] [phone] Mon-Thur 8:00-5:30, Fri 8:00-5:00 Babies seen by providers at [facility] Accepting Medicaid  [place] ([address]) [facility] Dixon, PA; [place], MD; Pickard, MD; Tapia, PA [address], [place], [place] [address] [phone] Mon-Fri 8:00-5:00 Babies seen by providers at [facility] Accepting Medicaid   [place] ([address]) [facility] [place] Masneri, DO; Meyers, MD; Nelson, PA [address], [place], [place] [address] [phone] Mon-Fri 8:00-5:00 Babies seen by providers at [facility] Does NOT accept Medicaid Limited appointment availability, please call early in hospitalization  [facility] [place] Kunedd, DO; McGowen, MD [address], [place], [place] [address] [phone] Mon-Fri 8:00-5:00 Babies seen by [facility] providers Does NOT accept Medicaid [facility] [place] Cameron, MD; MacDonald, MD; Michaels, PA; Nayak, MD [address] [place] [address], [place], [place] [address] [phone] Mon-Fri 8:00-5:00 After hours clinic ([address] , [place] [address]) [phone] Mon-Fri 5:00-8:00, Sat 12:00-6:00, Sun 10:00-4:00 Babies seen by [facility] providers Accepting Medicaid [facility] [place] [address], [place], [place]  [address] [phone]   Fax - [phone]  [place] ([address]) [facility] [place] [facility] Andy, MD [address], [place], [place] [address] [phone] Mon-Fri 8:00-5:00 Babies seen by [facility] providers Does NOT accept Medicaid [facility] [place] ([facility] [place]) Eksir, MD [address], [place], [place] [address] [phone] Mon-Thur 8:00-7:00, Fri 8:00-5:00, Sat 8:00-12:00 Babies seen by providers at [facility] Accepting Medicaid - but does not have vaccinations in office (must be received elsewhere) Limited availability, please call early in hospitalization  [place] ([address]) [facility]  Charlene Flemming, MD [address], [place] [address] [phone]  Fax [phone]  [facility]  Kimberly [place], MD, Annamarie Streilein, PA, Carla Hampton, PA [address] [place], [place]  [address] [phone] [facility]  [address], [place], [place] [address] [phone] [address], [place], [place] [address] [phone] (West Office)  [facility] [address], [place], [place] [address] [phone] [facility] [address], [place], [place] [address] [phone] [facility] [address], [place], [place] [address] [phone] [facility] [address], [place], [place] [address] [phone] [facility] [address], [place], [place] [address] [phone] [facility] [address], [place], [place] [address] [phone] [facility]  [address], [place], [place] [address] [phone] Dr[facility] [address] [place] [address], [place], [place] [address] [phone] [facility] [address], [address], [place], [place] [address] [phone] [facility] [address], , [place] [address] [phone]  

## 2022-03-29 NOTE — Progress Notes (Signed)
Pt here to discuss hysterectomy. Pt however has no financial aid Pt will apply for financial  aid and once approved, she will return Live interrupter used during today's visit She was also referred back to the [facility] for her HTN

## 2022-05-07 ENCOUNTER — Other Ambulatory Visit: Payer: Self-pay

## 2022-05-07 DIAGNOSIS — N6325 Unspecified lump in the left breast, overlapping quadrants: Secondary | ICD-10-CM

## 2022-07-18 ENCOUNTER — Ambulatory Visit: Payer: Self-pay

## 2022-07-18 ENCOUNTER — Ambulatory Visit: Payer: Self-pay | Admitting: Hematology and Oncology

## 2022-07-18 ENCOUNTER — Ambulatory Visit
Admission: RE | Admit: 2022-07-18 | Discharge: 2022-07-18 | Disposition: A | Discharge status: Home / Self Care | Payer: No Typology Code available for payment source | Source: Ambulatory Visit | Attending: Obstetrics and Gynecology | Admitting: Obstetrics and Gynecology

## 2022-07-18 VITALS — BP 187/116 | Wt 148.0 lb

## 2022-07-18 DIAGNOSIS — N6325 Unspecified lump in the left breast, overlapping quadrants: Secondary | ICD-10-CM

## 2022-07-18 DIAGNOSIS — Z124 Encounter for screening for malignant neoplasm of cervix: Secondary | ICD-10-CM

## 2022-07-18 DIAGNOSIS — Z01419 Encounter for gynecological examination (general) (routine) without abnormal findings: Secondary | ICD-10-CM

## 2022-07-18 NOTE — Progress Notes (Addendum)
[name] is a 42 y.o. G2P2 female who presents to [facility] clinic today with complaint of left breast mass. Follow up of probable benign.    Pap Smear: Pap smear completed today. Last Pap smear was 05/01/21 and was abnormal - ASCUS/ HPV+ . Per patient has no history of an abnormal Pap smear. 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 Last Pap smear result is available in Epic.   Physical exam: Breasts Breasts symmetrical. No skin abnormalities bilateral breasts. No nipple retraction bilateral breasts. No nipple discharge bilateral breasts. No lymphadenopathy. No lumps palpated right breasts. Left breast lump noted at 9 o'clock.  MS DIGITAL DIAG TOMO BILAT  Result Date: 07/18/2022 CLINICAL DATA:  Short-term follow-up for 2 adjacent probably benign left breast masses, initially assessed as palpable lumps with diagnostic mammography and ultrasound on 07/12/2021. EXAM: DIGITAL DIAGNOSTIC BILATERAL MAMMOGRAM WITH TOMOSYNTHESIS; ULTRASOUND LEFT BREAST LIMITED TECHNIQUE: Bilateral digital diagnostic mammography and breast tomosynthesis was performed.; Targeted ultrasound examination of the left breast was performed. COMPARISON:  Previous exam(s). ACR Breast Density Category d: The breasts are extremely dense, which lowers the sensitivity of mammography. FINDINGS: There are no defined masses, areas of significant asymmetry, areas of architectural distortion or suspicious calcifications. No mammographic change. Targeted left breast ultrasound is performed, showing 2 adjacent oval circumscribed hypoechoic masses in the left breast at 9 o'clock, 5 cm the nipple. The more superficial measures 1.3 x 0.8 x 1.1 cm, 1.2 x 0.8 x 1.1 cm on 07/12/2021. The deeper mass measures  1.4 x 0.8 x 0.9 cm, 1.2 x 0.7 x 1.1 cm on 07/12/2021. IMPRESSION: 1. Two adjacent probably benign left breast masses at 9 o'clock, without significant change over the course of 1 year. RECOMMENDATION: 1. Diagnostic bilateral mammography and left breast ultrasound in 1 year to provide 2 years of stability for the probably benign left breast masses. I have discussed the findings and recommendations with the patient. If applicable, a reminder letter will be sent to the patient regarding the next appointment. BI-RADS CATEGORY  3: Probably benign. Electronically Signed   By: Lajean Manes M.D.   On: 07/18/2022 10:49   MS DIGITAL DIAG TOMO BILAT  Result Date: 07/12/2021 CLINICAL DATA:  42 year old presenting with a possible palpable lump in the inner LEFT breast at the near 9 o'clock position. This is the patient's initial baseline mammogram. She states no family history of breast cancer. EXAM: DIGITAL DIAGNOSTIC BILATERAL MAMMOGRAM WITH TOMOSYNTHESIS AND CAD; ULTRASOUND LEFT BREAST LIMITED TECHNIQUE: Bilateral digital diagnostic mammography and breast tomosynthesis was performed. The images were evaluated with computer-aided detection.; Targeted ultrasound examination of the left breast was performed. COMPARISON:  None. ACR Breast Density Category d: The breast tissue is extremely dense, which lowers the sensitivity of mammography. FINDINGS: Full field CC and MLO views of both breasts and a spot tangential view of the palpable concern in the LEFT breast were obtained. RIGHT: No findings suspicious for malignancy. LEFT: No findings suspicious for malignancy. Dense fibroglandular tissue is present in the inner breast at the site of palpable concern. Targeted ultrasound is performed, demonstrating an oval parallel circumscribed hypoechoic mass at the 9 o'clock position 5 cm from the nipple measuring approximately 1.2 x 0.8 x 1.1 cm, demonstrating posterior acoustic enhancement and peripheral internal power Doppler flow,  corresponding to the palpable concern. Adjacent  to this palpable mass is a second oval parallel hypoechoic mass at the 9 o'clock position 5 cm from nipple measuring approximately 1.2 x 0.7 x 1.0 cm, demonstrating posterior acoustic enhancement and no internal power Doppler flow. Between these 2 masses is a benign cyst measuring 0.6 x 0.4 x 0.5 cm, demonstrating posterior acoustic enhancement and no internal power Doppler flow. On correlative physical examination, there is a freely mobile palpable approximate 1 cm lump in the inner breast corresponding what the patient is feeling. IMPRESSION: 1. Adjacent likely benign masses involving the outer LEFT breast at the 9 o'clock position 5 cm from the nipple, each measuring 1.2 cm, likely fibroadenomas. A benign 0.6 cm cyst is present between the 2 masses. 2. No mammographic evidence of malignancy involving the RIGHT breast. RECOMMENDATION: LEFT breast ultrasound in 6 months. I have discussed the findings and recommendations with the patient. Communication with the patient was achieved with the assistance of a certified interpreter. If applicable, a reminder letter will be sent to the patient regarding the next appointment. BI-RADS CATEGORY  3: Probably benign. Electronically Signed   By: Evangeline Dakin M.D.   On: 07/12/2021 12:04     Pelvic/Bimanual Ext Genitalia No lesions, no swelling and no discharge observed on external genitalia.        Vagina Vagina pink and normal texture. No lesions or discharge observed in vagina.        Cervix Cervix is present. Cervix pink and of normal texture. No discharge observed.    Uterus Uterus is present and palpable. Uterus in normal position and normal size.        Adnexae Bilateral ovaries present and palpable. No tenderness on palpation.         Rectovaginal No rectal exam completed today since patient had no rectal complaints. No skin abnormalities observed on exam.     Smoking History: Patient has never  smoked and was not referred to quit line.    Patient Navigation: Patient education provided. Access to services provided for patient through [place] interpreter provided. No transportation provided   Colorectal Cancer Screening: Per patient has never had colonoscopy completed No complaints today.    Breast and Cervical Cancer Risk Assessment: Patient does not have family history of breast cancer, known genetic mutations, or radiation treatment to the chest before age 62. Patient has history of cervical dysplasia, immunocompromised, or DES exposure in-utero.  Risk Scores as of 07/18/2022     [place]           5-year 0.4 %   Lifetime 7.33 %   This patient is [place]/Lat[place] but has no documented birth country, so the [place] used data from [place] patients to calculate their risk score. Document a birth country in the Demographics activity for a more accurate score.         Last calculated by Claretha Cooper, CMA on 07/18/2022 at  9:24 AM        A: [facility] exam with pap smear Complaint of left breast mass being followed by radiology for probable benign left mass.  P: [facility] to [facility]. Appointment scheduled 07/18/22.  Melodye Ped, NP 07/18/2022 9:36 AM

## 2022-07-18 NOTE — Patient Instructions (Signed)
[place] about self breast awareness and gave educational materials to take home. Patient did need a Pap smear today due to last Pap smear was in 2023 per patient. Let her know [facility] will cover Pap smears every 5 years unless has a history of abnormal Pap smears. Referred patient to the [facility] for diagnostic mammogram. Appointment scheduled for 07/18/22. Patient aware of appointment and will be there. Let patient know will follow up with her within the next couple weeks with results. [facility] verbalized understanding.  Melodye Ped, NP 9:43 AM

## 2022-07-24 LAB — CYTOLOGY - PAP
Adequacy: ABSENT
Comment: NEGATIVE
Diagnosis: NEGATIVE
High risk HPV: NEGATIVE

## 2022-11-23 IMAGING — MG DIGITAL DIAGNOSTIC BILAT W/ TOMO W/ CAD
6 of 10 series · 6 of 30 positions shown · non-contrast
Comparison: None.

CLINICAL DATA: 41-year-old presenting with a possible palpable lump
in the inner LEFT breast at the near 9 o'clock position. This is the
patient's initial baseline mammogram. She states no family history
of breast cancer.

EXAM:
DIGITAL DIAGNOSTIC BILATERAL MAMMOGRAM WITH TOMOSYNTHESIS AND CAD;
ULTRASOUND LEFT BREAST LIMITED
TECHNIQUE: Bilateral digital diagnostic mammography and breast tomosynthesis
was performed. The images were evaluated with computer-aided
detection.; Targeted ultrasound examination of the left breast was
performed.

[L CC synth-2D]
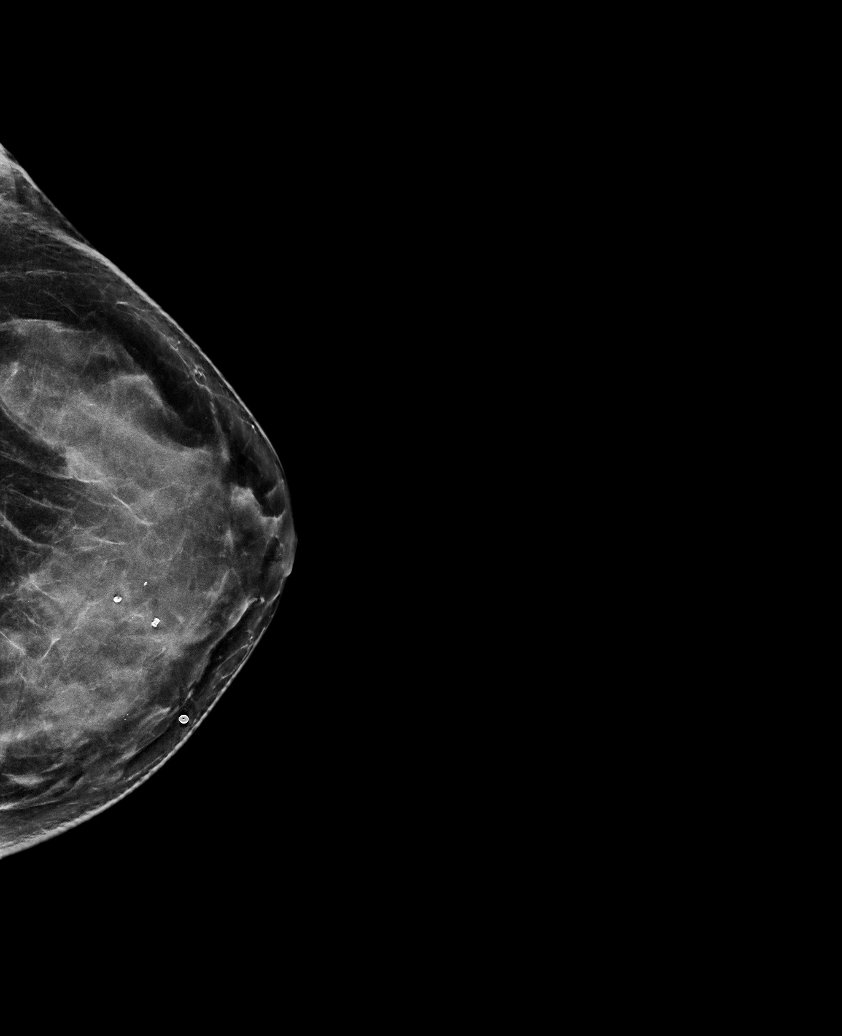

[L MLO synth-2D]
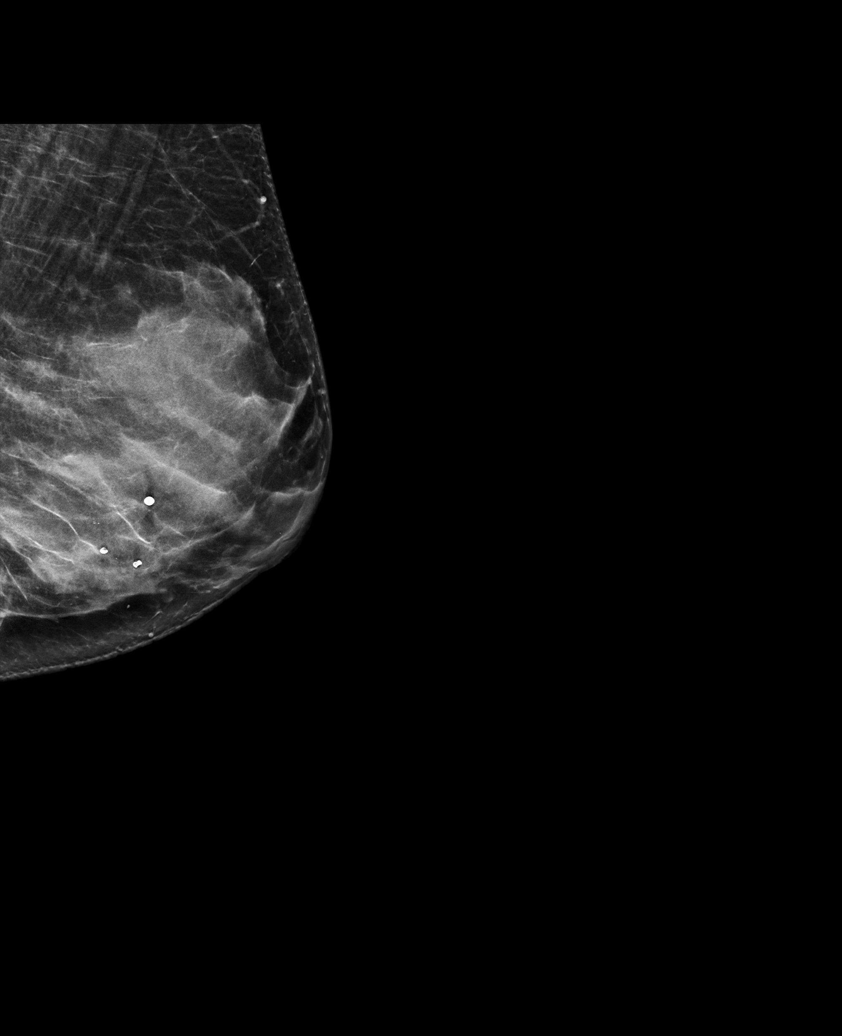

[L TAN synth-2D]
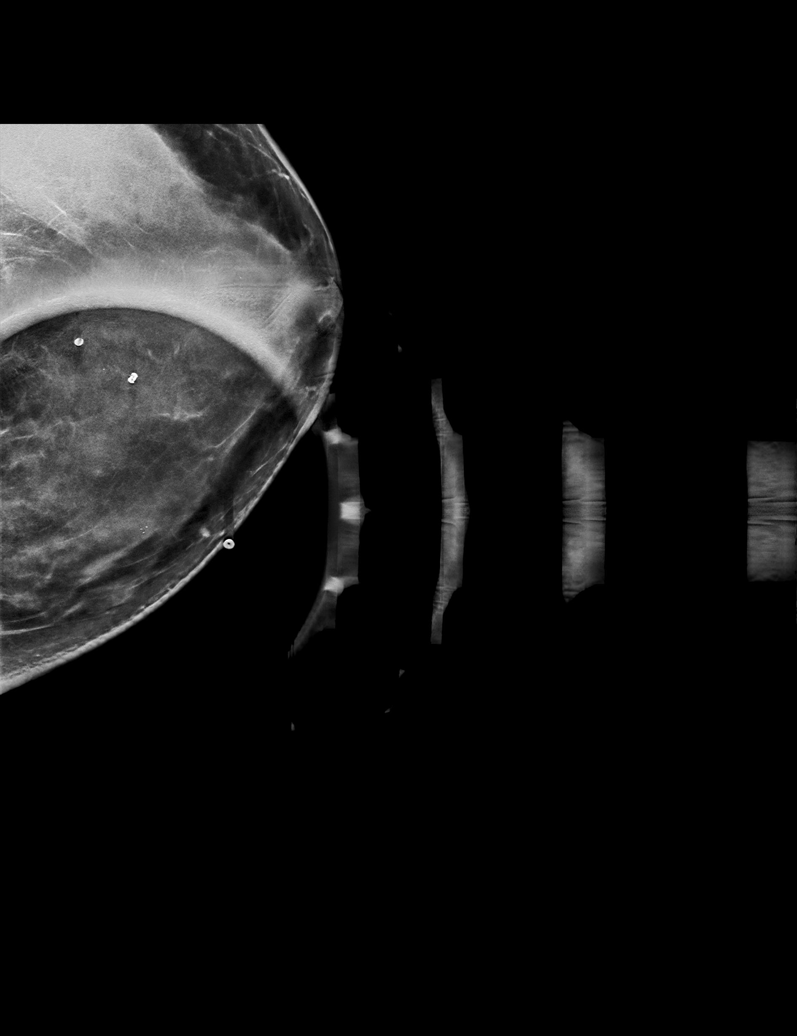

[R MLO synth-2D]
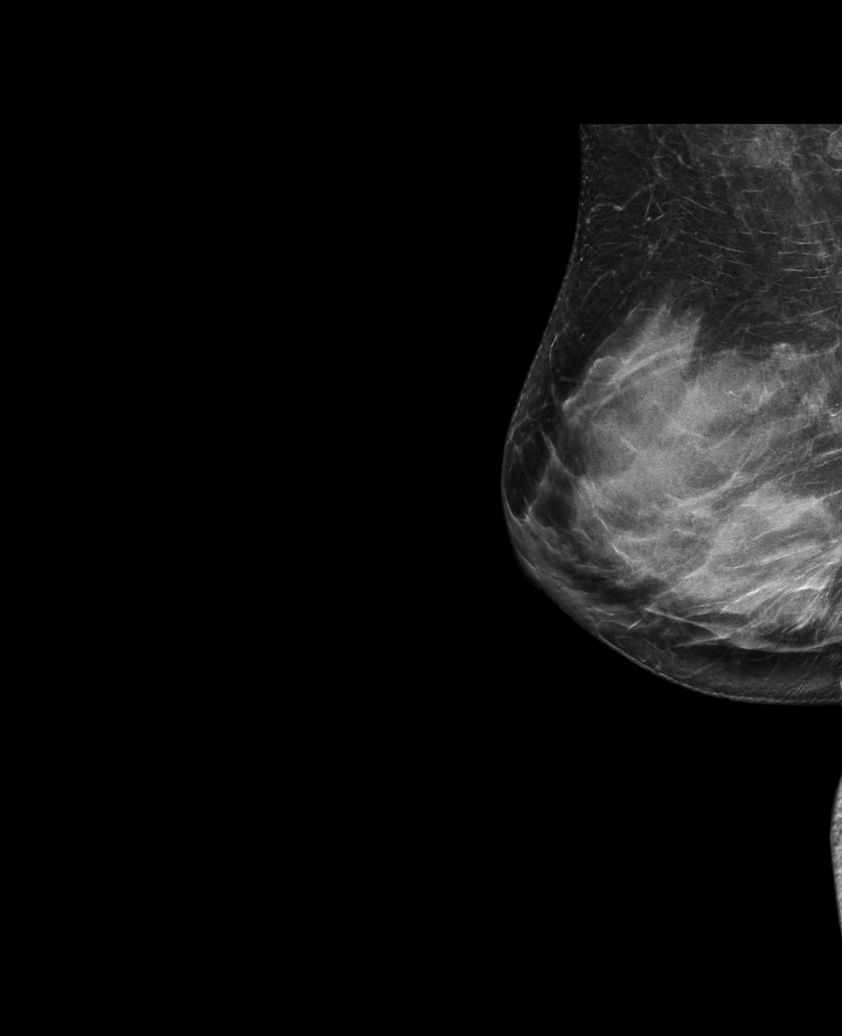

[R CC synth-2D]
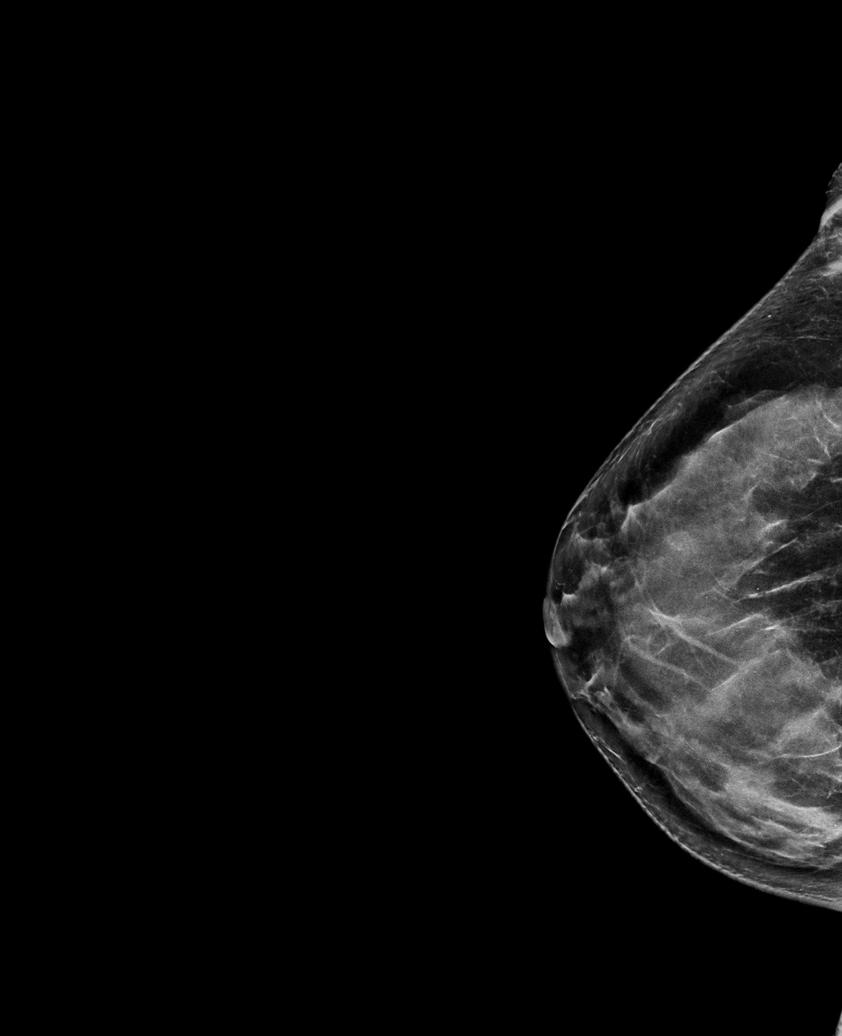

[L MLO tomo · tomo slice 35/69.0]
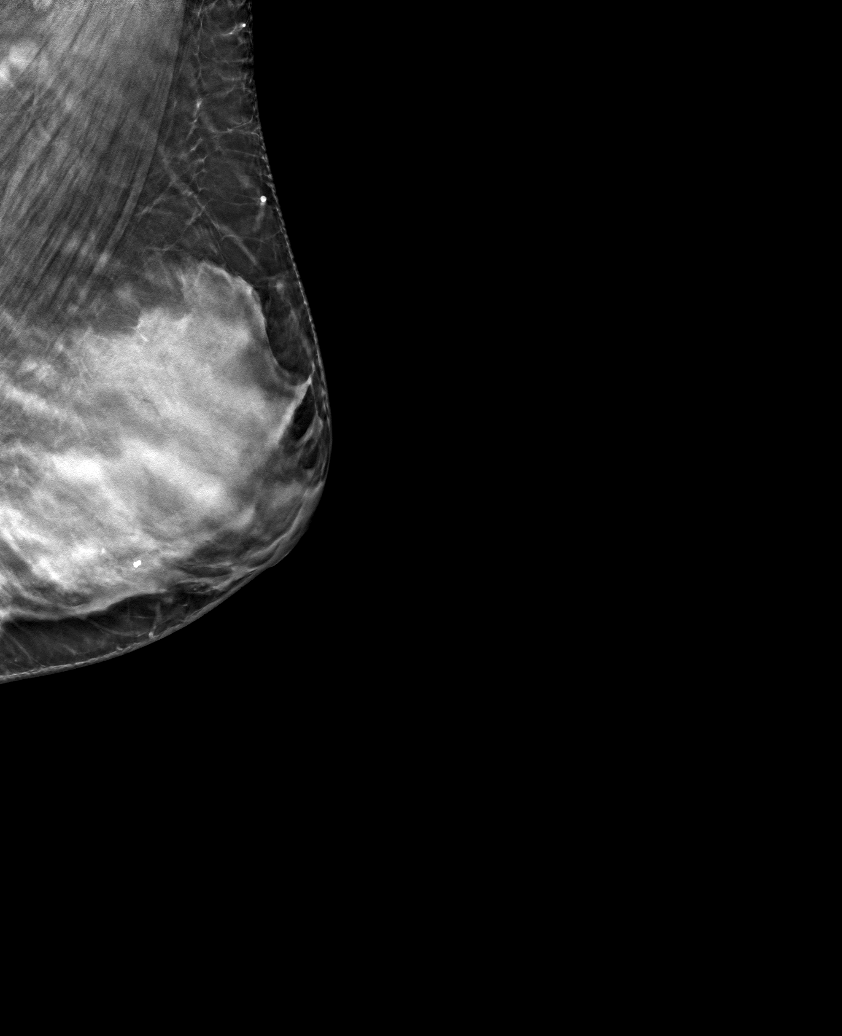

[6 of 30 positions shown; findings below may reference images not displayed]

ACR Breast Density Category d: The breast tissue is extremely dense,
which lowers the sensitivity of mammography.
FINDINGS: Full field CC and MLO views of both breasts and a spot tangential
view of the palpable concern in the LEFT breast were obtained.

RIGHT: No findings suspicious for malignancy.

LEFT: No findings suspicious for malignancy. Dense fibroglandular
tissue is present in the inner breast at the site of palpable
concern.

Targeted ultrasound is performed, demonstrating an oval parallel
circumscribed hypoechoic mass at the 9 o'clock position 5 cm from
the nipple measuring approximately 1.2 x 0.8 x 1.1 cm, demonstrating
posterior acoustic enhancement and peripheral internal power Doppler
flow, corresponding to the palpable concern.

Adjacent to this palpable mass is a second oval parallel hypoechoic
mass at the 9 o'clock position 5 cm from nipple measuring
approximately 1.2 x 0.7 x 1.0 cm, demonstrating posterior acoustic
enhancement and no internal power Doppler flow.

Between these 2 masses is a benign cyst measuring 0.6 x 0.4 x
cm, demonstrating posterior acoustic enhancement and no internal
power Doppler flow.

On correlative physical examination, there is a freely mobile
palpable approximate 1 cm lump in the inner breast corresponding
what the patient is feeling.
IMPRESSION: 1. Adjacent likely benign masses involving the outer LEFT breast at
the 9 o'clock position 5 cm from the nipple, each measuring 1.2 cm,
likely fibroadenomas. A benign 0.6 cm cyst is present between the 2
masses.
2. No mammographic evidence of malignancy involving the RIGHT
breast.

RECOMMENDATION:
LEFT breast ultrasound in 6 months.

I have discussed the findings and recommendations with the patient.
Communication with the patient was achieved with the assistance of a
certified interpreter. If applicable, a reminder letter will be sent
to the patient regarding the next appointment.

BI-RADS CATEGORY  3: Probably benign.

## 2022-12-19 IMAGING — US US PELVIS COMPLETE WITH TRANSVAGINAL
1 series · 15 of 25 positions shown · non-contrast
Comparison: None

CLINICAL DATA: Abnormal Pap smear.

EXAM:
TRANSABDOMINAL AND TRANSVAGINAL ULTRASOUND OF PELVIS
TECHNIQUE: Both transabdominal and transvaginal ultrasound examinations of the
pelvis were performed. Transabdominal technique was performed for
global imaging of the pelvis including uterus, ovaries, adnexal
regions, and pelvic cul-de-sac. It was necessary to proceed with
endovaginal exam following the transabdominal exam to visualize the
endometrium and ovaries to better advantage.

[Series 1: us pelvis complete with transvaginal · 15 of 49 slices shown]
[im 1/49]
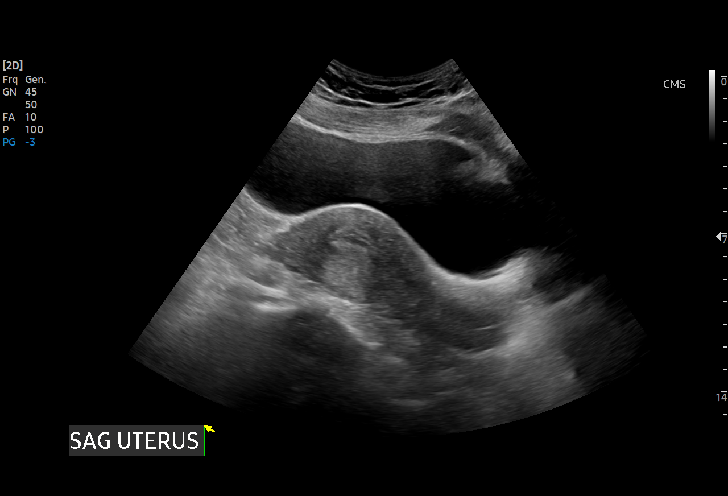
[im 5/49]
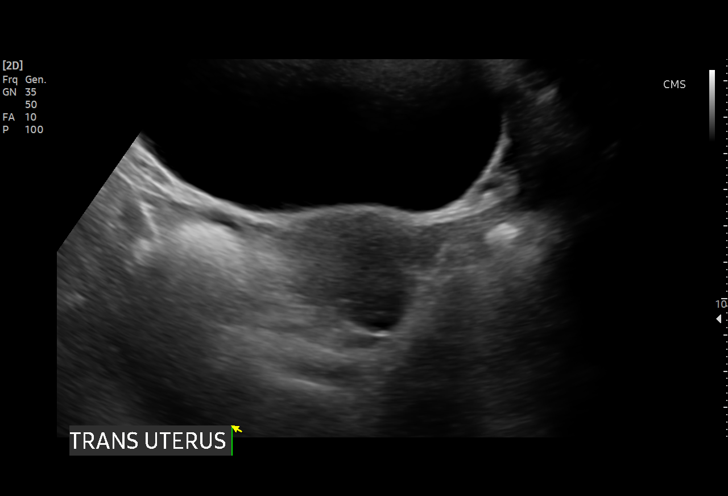
[im 9/49]
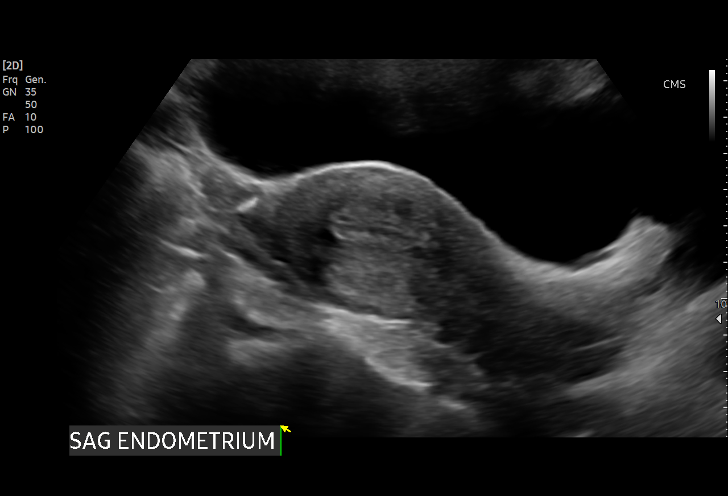
[im 11/49]
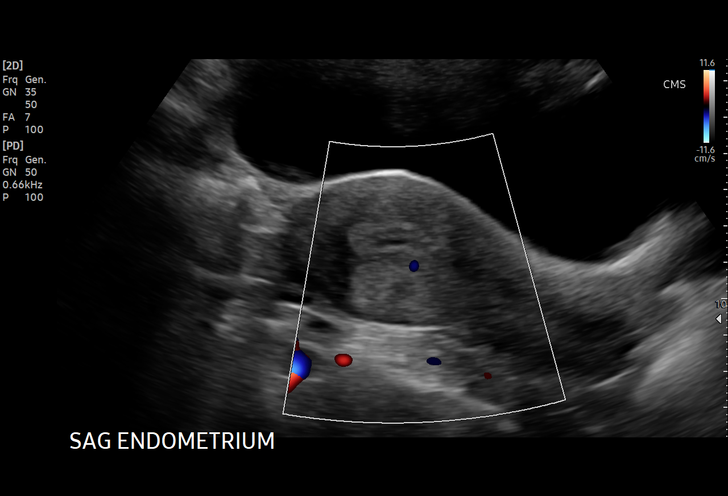
[im 15/49]
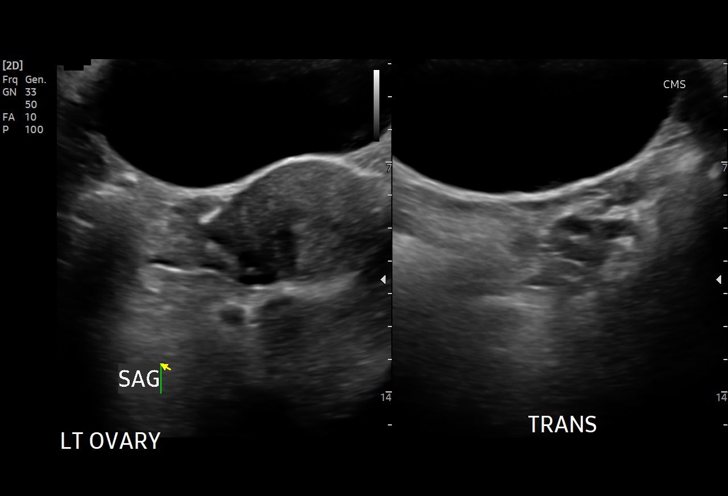
[im 19/49]
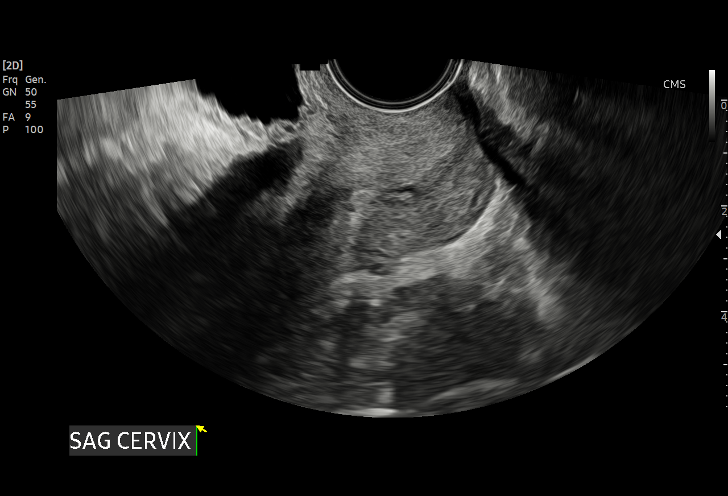
[im 21/49]
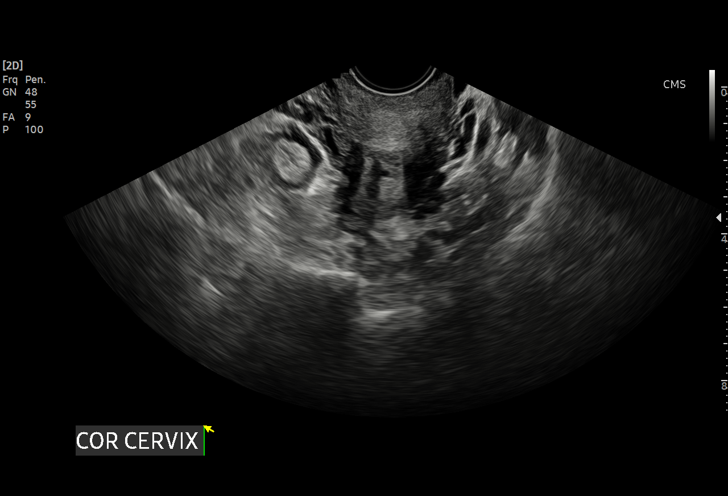
[im 25/49]
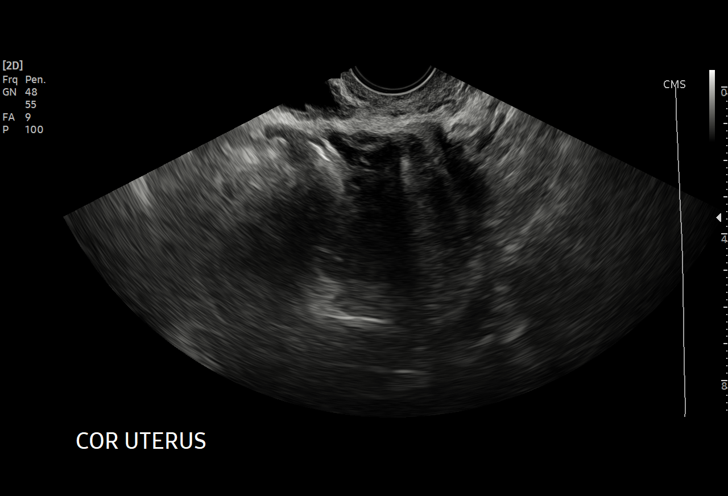
[im 29/49]
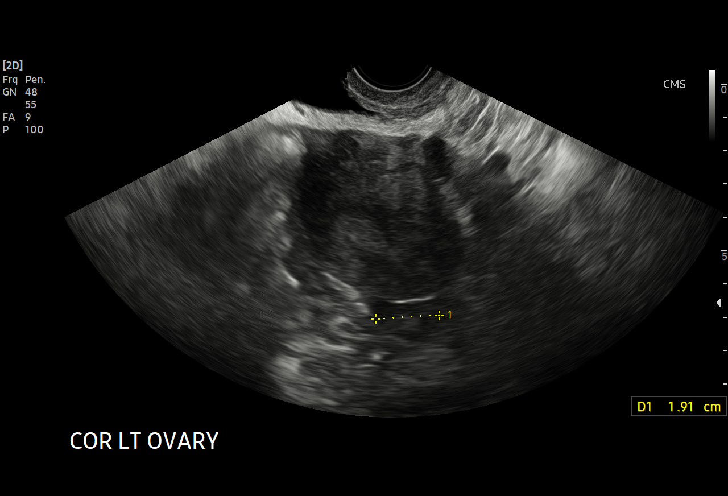
[im 31/49]
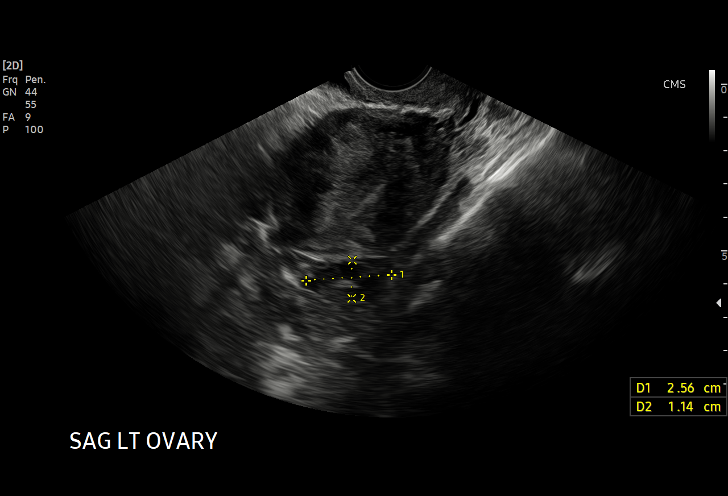
[im 35/49]
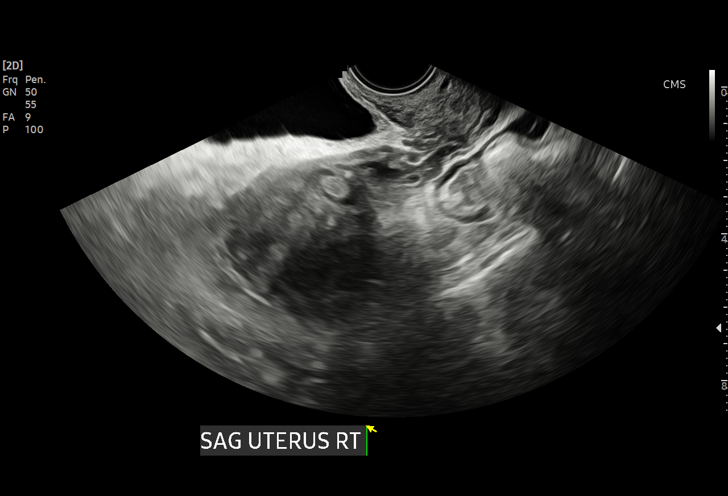
[im 39/49]
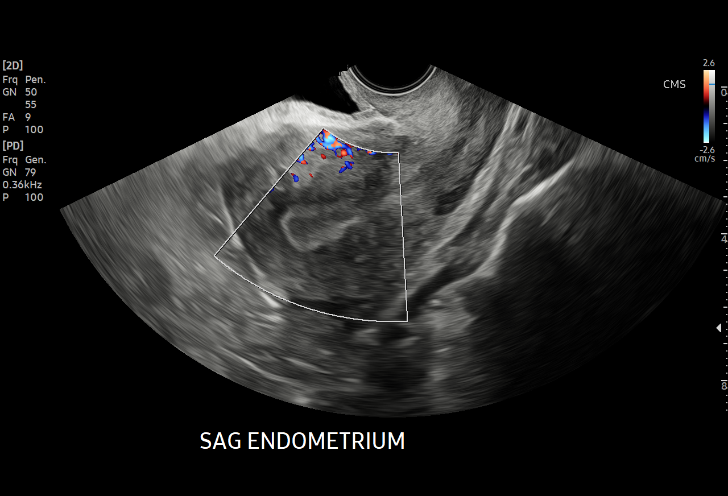
[im 41/49]
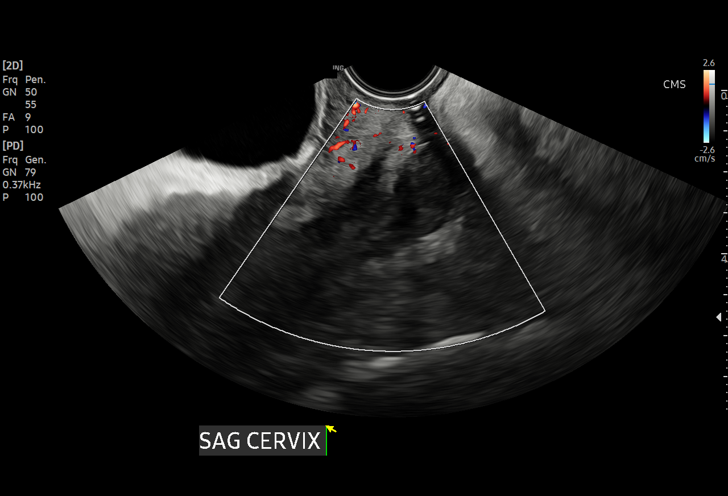
[im 45/49]
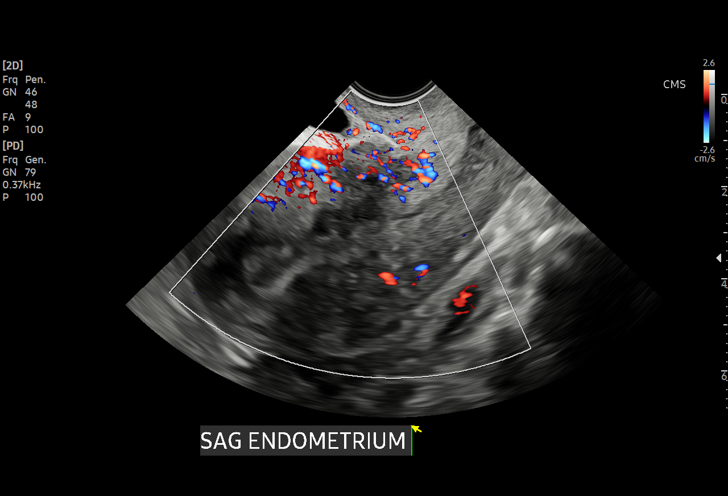
[im 49/49]
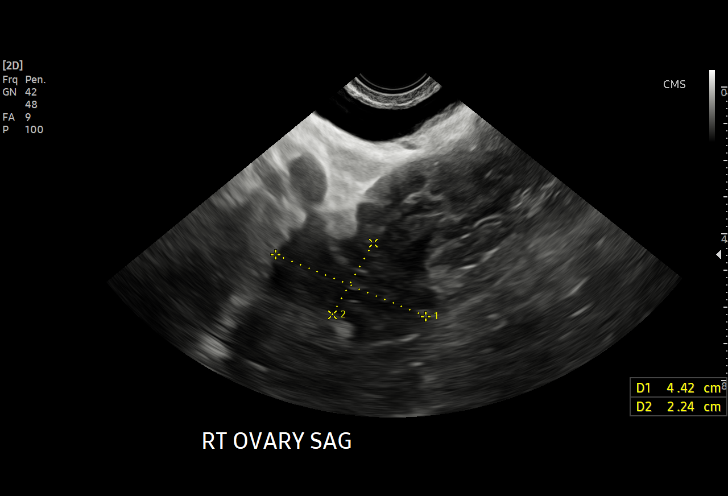

[15 of 25 positions shown; findings below may reference images not displayed]

FINDINGS: Uterus

Measurements: 9.9 x 3.9 x 5.1 cm = volume: 103.5 mL. No fibroids or
other mass visualized.

Endometrium

Thickness: 10 mm.  No focal abnormality visualized.

Right ovary

Measurements: 2.4 x 2.6 x 2.0 cm = volume: 6.5 mL. Normal
appearance/no adnexal mass.

Left ovary

Measurements: 2.5 x 1.0 x 1.6 cm = volume: 2.1 mL. Normal
appearance/no adnexal mass.

Other findings

No abnormal free fluid.
IMPRESSION: Normal transabdominal and endovaginal pelvic ultrasound.

## 2023-07-11 ENCOUNTER — Telehealth: Payer: Self-pay

## 2023-07-11 NOTE — Telephone Encounter (Signed)
Telephoned patient at mobile number using interpreter#[phone]. Left a voice message with [facility] contact information.

## 2023-07-15 ENCOUNTER — Other Ambulatory Visit: Payer: Self-pay

## 2023-07-15 DIAGNOSIS — N6325 Unspecified lump in the left breast, overlapping quadrants: Secondary | ICD-10-CM

## 2023-07-29 ENCOUNTER — Ambulatory Visit: Payer: Self-pay | Admitting: *Deleted

## 2023-07-29 VITALS — BP 176/108 | Wt 147.0 lb

## 2023-07-29 DIAGNOSIS — Z124 Encounter for screening for malignant neoplasm of cervix: Secondary | ICD-10-CM

## 2023-07-29 NOTE — Progress Notes (Signed)
 [name] is a 43 y.o. G2P2 female who presents to [facility] clinic today with complaint of left breast lump x 2 years that is painful at times. Patient rates the pain at a 5 out of 10.    Pap Smear: Pap smear completed today. Last Pap smear was 07/18/2022 at [facility] clinic and was normal with negative HPV. Per patient has history of an abnormal Pap smear 05/01/2021 that was ASC-H/AGC with positive HPV. 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 Last Pap smear result is available in Epic.   Physical exam: Breasts Breasts symmetrical. No skin abnormalities bilateral breasts. No nipple retraction bilateral breasts. No nipple discharge bilateral breasts. No lymphadenopathy. No lumps palpated right breast. Palpated a lump within the left breast at 9 o'clock 5 cm from the nipple. Complaints of left inner lower quadrant breast pain on exam.   MS DIGITAL DIAG TOMO BILAT Result Date: 07/18/2022 CLINICAL DATA:  Short-term follow-up for 2 adjacent probably benign left breast masses, initially assessed as palpable lumps with diagnostic mammography and ultrasound on 07/12/2021. EXAM: DIGITAL DIAGNOSTIC BILATERAL MAMMOGRAM WITH TOMOSYNTHESIS; ULTRASOUND LEFT BREAST LIMITED TECHNIQUE: Bilateral digital diagnostic mammography and breast tomosynthesis was performed.; Targeted ultrasound examination of the left breast was performed. COMPARISON:  Previous exam(s). ACR Breast Density Category d: The breasts are extremely dense, which lowers the sensitivity of mammography. FINDINGS: There are no defined masses, areas of significant asymmetry, areas of architectural distortion or suspicious calcifications. No mammographic change. Targeted left breast ultrasound is performed, showing 2 adjacent oval  circumscribed hypoechoic masses in the left breast at 9 o'clock, 5 cm the nipple. The more superficial measures 1.3 x 0.8 x 1.1 cm, 1.2 x 0.8 x 1.1 cm on 07/12/2021. The deeper mass measures 1.4 x 0.8 x 0.9 cm, 1.2 x 0.7 x 1.1 cm on 07/12/2021. IMPRESSION: 1. Two adjacent probably benign left breast masses at 9 o'clock, without significant change over the course of 1 year. RECOMMENDATION: 1. Diagnostic bilateral mammography and left breast ultrasound in 1 year to provide 2 years of stability for the probably benign left breast masses. I have discussed the findings and recommendations with the patient. If applicable, a reminder letter will be sent to the patient regarding the next appointment. BI-RADS CATEGORY  3: Probably benign. Electronically Signed   By: Amie Portland M.D.   On: 07/18/2022 10:49  MS DIGITAL DIAG TOMO BILAT Result Date: 07/12/2021 CLINICAL DATA:  43 year old presenting with a possible palpable lump in the inner LEFT breast at the near 9 o'clock position. This is the patient's initial baseline mammogram. She states no family history of breast cancer. EXAM: DIGITAL DIAGNOSTIC BILATERAL MAMMOGRAM WITH TOMOSYNTHESIS AND CAD; ULTRASOUND LEFT BREAST LIMITED TECHNIQUE: Bilateral digital diagnostic mammography and breast tomosynthesis was performed. The images were evaluated with computer-aided detection.; Targeted ultrasound examination of the left breast was performed. COMPARISON:  None. ACR Breast Density Category d: The breast tissue is extremely dense, which lowers the sensitivity of mammography. FINDINGS: Full field CC and MLO views of both breasts and a spot tangential view of the palpable concern in the LEFT breast were obtained. RIGHT: No findings suspicious for malignancy. LEFT: No findings suspicious for malignancy. Dense fibroglandular tissue is present in the inner breast at the site of palpable concern. Targeted ultrasound is performed, demonstrating  an oval parallel circumscribed  hypoechoic mass at the 9 o'clock position 5 cm from the nipple measuring approximately 1.2 x 0.8 x 1.1 cm, demonstrating posterior acoustic enhancement and peripheral internal power Doppler flow, corresponding to the palpable concern. Adjacent to this palpable mass is a second oval parallel hypoechoic mass at the 9 o'clock position 5 cm from nipple measuring approximately 1.2 x 0.7 x 1.0 cm, demonstrating posterior acoustic enhancement and no internal power Doppler flow. Between these 2 masses is a benign cyst measuring 0.6 x 0.4 x 0.5 cm, demonstrating posterior acoustic enhancement and no internal power Doppler flow. On correlative physical examination, there is a freely mobile palpable approximate 1 cm lump in the inner breast corresponding what the patient is feeling. IMPRESSION: 1. Adjacent likely benign masses involving the outer LEFT breast at the 9 o'clock position 5 cm from the nipple, each measuring 1.2 cm, likely fibroadenomas. A benign 0.6 cm cyst is present between the 2 masses. 2. No mammographic evidence of malignancy involving the RIGHT breast. RECOMMENDATION: LEFT breast ultrasound in 6 months. I have discussed the findings and recommendations with the patient. Communication with the patient was achieved with the assistance of a certified interpreter. If applicable, a reminder letter will be sent to the patient regarding the next appointment. BI-RADS CATEGORY  3: Probably benign. Electronically Signed   By: Hulan Saas M.D.   On: 07/12/2021 12:04     Pelvic/Bimanual Ext Genitalia No lesions, no swelling and no discharge observed on external genitalia.        Vagina Vagina pink and normal texture. No lesions or discharge observed in vagina.      Cervix Cervix is present. Cervix pink and of normal texture. No discharge observed.    Uterus Uterus is present and palpable. Uterus in normal position and normal size.        Adnexae Bilateral ovaries present and palpable. No tenderness  on palpation.         Rectovaginal No rectal exam completed today since patient had no rectal complaints. No skin abnormalities observed on exam.     Smoking History: Patient has never smoked.   Patient Navigation: Patient education provided. Access to services provided for patient through [place] program. Spanish interpreter Natale Lay from [facility] provided.    Breast and Cervical Cancer Risk Assessment: Patient does not have family history of breast cancer, known genetic mutations, or radiation treatment to the chest before age 49. Patient has history of cervical dysplasia. Patient has no history of being immunocompromised or DES exposure in-utero.  Risk Scores as of Encounter on 07/29/2023     Dondra Spry           5-year 0.23%   Lifetime 4.62%            Last calculated by Caprice Red, CMA on 07/29/2023 at  9:19 AM        A: [facility] exam with pap smear Complaint of left breast lump and pain.  P: Referred patient to the [facility] for a diagnostic mammogram per recommendation. Appointment scheduled Thursday, July 31, 2023 at 1010.  Priscille Heidelberg, RN 07/29/2023 9:38 AM

## 2023-07-29 NOTE — Patient Instructions (Signed)
 Explained breast self awareness with Joy Chen. Pap smear completed today. Let her know if today's Pap smear is normal and HPV negative that her next Pap smear will be due in one year due to her history of an abnormal Pap smear. Referred patient to the [facility] for a diagnostic mammogram per recommendation. Appointment scheduled Thursday, July 31, 2023 at 1010. Patient aware of appointment and will be there. Let patient know will follow up with her within the next couple weeks with results of Pap smear by letter or phone. [employer] verbalized understanding.  Tadeo Besecker, Kathaleen Maser, RN 9:38 AM

## 2023-07-31 ENCOUNTER — Ambulatory Visit
Admission: RE | Admit: 2023-07-31 | Discharge: 2023-07-31 | Disposition: A | Discharge status: Home / Self Care | Payer: Self-pay | Source: Ambulatory Visit | Attending: Obstetrics and Gynecology | Admitting: Obstetrics and Gynecology

## 2023-07-31 DIAGNOSIS — N6325 Unspecified lump in the left breast, overlapping quadrants: Secondary | ICD-10-CM

## 2023-07-31 LAB — CYTOLOGY - PAP
Comment: NEGATIVE
Diagnosis: NEGATIVE
High risk HPV: NEGATIVE
